# Patient Record
Sex: Male | Born: 1935 | Race: White | Hispanic: No | Smoking: Former smoker
Health system: Southern US, Community
[De-identification: ages and names within clinical notes are randomized; demographics above are authoritative.]

## PROBLEM LIST (undated history)

## (undated) DIAGNOSIS — I2510 Atherosclerotic heart disease of native coronary artery without angina pectoris: Secondary | ICD-10-CM

## (undated) DIAGNOSIS — E785 Hyperlipidemia, unspecified: Secondary | ICD-10-CM

## (undated) DIAGNOSIS — I499 Cardiac arrhythmia, unspecified: Secondary | ICD-10-CM

## (undated) DIAGNOSIS — Z950 Presence of cardiac pacemaker: Secondary | ICD-10-CM

## (undated) DIAGNOSIS — I10 Essential (primary) hypertension: Secondary | ICD-10-CM

## (undated) DIAGNOSIS — N400 Enlarged prostate without lower urinary tract symptoms: Secondary | ICD-10-CM

## (undated) DIAGNOSIS — D649 Anemia, unspecified: Secondary | ICD-10-CM

## (undated) DIAGNOSIS — M1990 Unspecified osteoarthritis, unspecified site: Secondary | ICD-10-CM

## (undated) HISTORY — PX: INSERT / REPLACE / REMOVE PACEMAKER: SUR710

## (undated) HISTORY — PX: CARDIAC CATHETERIZATION: SHX172

## (undated) HISTORY — PX: CORONARY ARTERY BYPASS GRAFT: SHX141

## (undated) HISTORY — PX: BACK SURGERY: SHX140

## (undated) HISTORY — PX: EYE SURGERY: SHX253

## (undated) HISTORY — PX: PENILE PROSTHESIS IMPLANT: SHX240

---

## 2013-10-11 DIAGNOSIS — I442 Atrioventricular block, complete: Secondary | ICD-10-CM | POA: Insufficient documentation

## 2013-11-18 ENCOUNTER — Other Ambulatory Visit (HOSPITAL_COMMUNITY): Payer: Self-pay | Admitting: Orthopaedic Surgery

## 2013-11-26 ENCOUNTER — Encounter (HOSPITAL_COMMUNITY)
Admission: RE | Admit: 2013-11-26 | Discharge: 2013-11-26 | Disposition: A | Discharge status: Home / Self Care | Payer: Medicare Other | Source: Ambulatory Visit | Attending: Orthopaedic Surgery | Admitting: Orthopaedic Surgery

## 2013-11-26 ENCOUNTER — Encounter (HOSPITAL_COMMUNITY): Payer: Self-pay

## 2013-11-26 ENCOUNTER — Other Ambulatory Visit: Payer: Self-pay

## 2013-11-26 HISTORY — DX: Benign prostatic hyperplasia without lower urinary tract symptoms: N40.0

## 2013-11-26 HISTORY — DX: Cardiac arrhythmia, unspecified: I49.9

## 2013-11-26 HISTORY — DX: Hyperlipidemia, unspecified: E78.5

## 2013-11-26 HISTORY — DX: Presence of cardiac pacemaker: Z95.0

## 2013-11-26 HISTORY — DX: Anemia, unspecified: D64.9

## 2013-11-26 HISTORY — DX: Essential (primary) hypertension: I10

## 2013-11-26 HISTORY — DX: Atherosclerotic heart disease of native coronary artery without angina pectoris: I25.10

## 2013-11-26 HISTORY — DX: Unspecified osteoarthritis, unspecified site: M19.90

## 2013-11-26 LAB — SURGICAL PCR SCREEN
MRSA, PCR: NEGATIVE
Staphylococcus aureus: POSITIVE — AB

## 2013-11-26 LAB — URINALYSIS, ROUTINE W REFLEX MICROSCOPIC
Bilirubin Urine: NEGATIVE
Glucose, UA: NEGATIVE mg/dL
Hgb urine dipstick: NEGATIVE
Ketones, ur: NEGATIVE mg/dL
Leukocytes, UA: NEGATIVE
NITRITE: NEGATIVE
Protein, ur: NEGATIVE mg/dL
SPECIFIC GRAVITY, URINE: 1.008 (ref 1.005–1.030)
UROBILINOGEN UA: 0.2 mg/dL (ref 0.0–1.0)
pH: 5.5 (ref 5.0–8.0)

## 2013-11-26 LAB — CBC
HCT: 41.1 % (ref 39.0–52.0)
Hemoglobin: 13.4 g/dL (ref 13.0–17.0)
MCH: 29.5 pg (ref 26.0–34.0)
MCHC: 32.6 g/dL (ref 30.0–36.0)
MCV: 90.5 fL (ref 78.0–100.0)
Platelets: 153 10*3/uL (ref 150–400)
RBC: 4.54 MIL/uL (ref 4.22–5.81)
RDW: 14.9 % (ref 11.5–15.5)
WBC: 5.5 10*3/uL (ref 4.0–10.5)

## 2013-11-26 LAB — COMPREHENSIVE METABOLIC PANEL
ALBUMIN: 4.3 g/dL (ref 3.5–5.2)
ALT: 10 U/L (ref 0–53)
ANION GAP: 19 — AB (ref 5–15)
AST: 24 U/L (ref 0–37)
Alkaline Phosphatase: 128 U/L — ABNORMAL HIGH (ref 39–117)
BILIRUBIN TOTAL: 0.6 mg/dL (ref 0.3–1.2)
BUN: 25 mg/dL — AB (ref 6–23)
CO2: 23 mEq/L (ref 19–32)
CREATININE: 1.58 mg/dL — AB (ref 0.50–1.35)
Calcium: 9.8 mg/dL (ref 8.4–10.5)
Chloride: 101 mEq/L (ref 96–112)
GFR calc Af Amer: 43 mL/min — ABNORMAL LOW (ref >90)
GFR calc non Af Amer: 37 mL/min — ABNORMAL LOW (ref >90)
Glucose, Bld: 87 mg/dL (ref 70–99)
Potassium: 4.4 mEq/L (ref 3.7–5.3)
Sodium: 143 mEq/L (ref 137–147)
Total Protein: 7.2 g/dL (ref 6.0–8.3)

## 2013-11-26 LAB — PROTIME-INR
INR: 0.98 (ref 0.00–1.49)
Prothrombin Time: 13 seconds (ref 11.6–15.2)

## 2013-11-26 NOTE — Progress Notes (Addendum)
Pacemaker is a St. Jude--Shane [name] is rep (# [id number]) Have left a message on his VM.  DA Information sent from [facility] on [address] In [place].  Office notes, "Evaluation for Periop Device management" in chart.  Have also sent to Dr. Lear NgLai Chow Kok, Md-cardiologist the "implanted cardiac device programming sheet"   DA Patient had tests done in [place], [place].  I faxed request to hospital and requested anything cardiac on this nice gentleman.  [phone]

## 2013-11-26 NOTE — Progress Notes (Deleted)
   11/26/13 1218  OBSTRUCTIVE SLEEP APNEA  Have you ever been diagnosed with sleep apnea through a sleep study? No  Do you snore loudly (loud enough to be heard through closed doors)?  0  Do you often feel tired, fatigued, or sleepy during the daytime? 0  Has anyone observed you stop breathing during your sleep? 0  Do you have, or are you being treated for high blood pressure? 1  BMI more than 35 kg/m2? 0  Age over 78 years old? 1  Neck circumference greater than 40 cm/16 inches? 1  Gender: 1  Obstructive Sleep Apnea Score 4  Score 4 or greater  Results sent to PCP

## 2013-11-26 NOTE — Progress Notes (Signed)
Occasionally sees MD @ [facility]  In [place], [place]

## 2013-11-26 NOTE — Pre-Procedure Instructions (Signed)
Shane [name]  11/26/2013   Your procedure is scheduled on:  Friday, Nov. 13th   Report [facility] at 5:30 AM.   Call this number if you have problems the morning of surgery: [phone]   Remember:   Do not eat food or drink liquids after midnight Thursday.   Take these medicines the morning of surgery with A SIP OF WATER: Nothing   Do not wear jewelry - no rings or watches.  Do not wear lotions or colognes.  You may NOT wear deodorant the morning of surgery.   Men may shave face and neck.  Do not bring valuables to the hospital.  [facility] is not responsible for any belongings or valuables.               Contacts, dentures or bridgework may not be worn into surgery.  Leave suitcase in the car. After surgery it may be brought to your room.  For patients admitted to the hospital, discharge time is determined by your treatment team.    Name and phone number of your driver:    Special Instructions: "Preparing for Surgery" instruction sheet.   Please read over the following fact sheets that you were given: Pain Booklet, Coughing and Deep Breathing, MRSA Information and Surgical Site Infection Prevention

## 2013-11-27 NOTE — Progress Notes (Signed)
Anesthesia Chart Review:  Patient is a 80 year old male scheduled for right THA on 11/29/13 by Dr. Ophelia CharterYates.  History includes former smoker, afib, St. Jude single chamber PPM (03/28/13; [facility] Dr. Marijean NiemannKeith Kyker), CAD s/p CABG 1981 with redo in 1992 (occluded LCX SVG, patient LIMA to LAD and SVG to RCA by 11/2012 cath), HLD, HTN, anemia, arthritis, BPH, back surgery X 4, penile prothesis implant. [place] recently moved to [place], [place].  His current cardiologist Dr. Lear NgLai Chow Kok with [facility] felt he was "low risk for periop cardiac complications." Permission to hold Eliquis 48 hours prior to surgery given.  Last PPM interrogation 11/14/13 with normal pacing function.  VP 66% of the time. No PCP is listed.   EKG on 11/26/13: afib at 70 bpm, LAD, right BBB. Tracing appears similar to prior tracing on 03/28/13 [facility] [place]  Cardiac [facility] Severe multi-vessel CAD with patent LIMA to LAD and SVG to RCA. Occluded SVG to LCX system. LM heavily calcified distal 80% eccentric stenosis at bifurcation. Ramus small with moderate diffuse disease. LAD proximal 100% occlusion. DIAG back fills from the LIMA. LCX proximal 60-70% lesion. OM1 ostail 50% and OM2 patent. RCA proximal and mid 100% occlusion. EDP 22. Medical therapy recommended. May benefit for PPM in view of significant bradycardia.  Holter to assess for pauses and a-flutter. If symptoms persistent then consider high risk PCI of the LM bifurcation.   Echo 10/25/07 [facility]): Technically difficult. Grossly normal LV size and EF > 65% with no definite segmental wall motion abnormalities. AV trileaflet with sclerosis. Dilated LA at 5.4 cm. Dilated RA. Mild AI, mild TR, with mildly elevated RVSP at 35 mm Hg, mild MR.  Preoperative labs noted.  BUN 25/Cr 1.58. Comparison [facility] indicate his Cr was 1.6 on 03/28/13 and on 12/04/12, so renal function appears stable.     Cardiology is aware of surgery  plans. If no acute changes then I could anticipate that he could proceed as planned.  Velna Ochsllison Sloan Takagi, PA-C [facility]/Anesthesiology Phone [phone] 11/27/2013 12:39 PM

## 2013-11-27 NOTE — H&P (Signed)
TOTAL HIP ADMISSION H&P  Patient is admitted for right total hip arthroplasty.  Subjective:  Chief Complaint: right hip pain  HPI: Shane [name], [age] y.o. male, has a history of pain and functional disability in the right hip(s) due to arthritis and patient has failed non-surgical conservative treatments for greater than 12 weeks to include NSAID's and/or analgesics, use of assistive devices and activity modification.  Onset of symptoms was gradual starting 2 years ago with gradually worsening course since that time.The patient noted no past surgery on the right hip(s).  Patient currently rates pain in the right hip at 8 out of 10 with activity. Patient has worsening of pain with activity and weight bearing, trendelenberg gait and pain that interfers with activities of daily living. Patient has evidence of subchondral sclerosis, joint subluxation and joint space narrowing by imaging studies. This condition presents safety issues increasing the risk of falls. This patient has had lumbar fusion..  There is no current active infection.  There are no active problems to display for this patient.  Past Medical History  Diagnosis Date  . Coronary artery disease   . Dysrhythmia     h/o  a flutter  . Hypertension   . Hyperlipidemia   . BPH (benign prostatic hyperplasia)   . Presence of permanent cardiac pacemaker     2015 for complete HB   St. Jude  pacer  . Arthritis   . Anemia     now taking iron pills    Past Surgical History  Procedure Laterality Date  . Coronary artery bypass graft      1981 with redo in 1992  . Cardiac catheterization      2014, EF is 55-60%  . Eye surgery      left eye cataract  . Insert / replace / remove pacemaker      St. Jude pacer  . Penile prosthesis implant    . Back surgery      has had 4 back surgeries in [place]    No prescriptions prior to admission   Not on File  History  Substance Use Topics  . Smoking status: Former Smoker -- 1.00  packs/day for 3.5 years    Quit date: 11/27/1987  . Smokeless tobacco: Not on file  . Alcohol Use: No     Comment: used to in his early yrs    No family history on file.   Review of Systems  Musculoskeletal: Positive for back pain and joint pain.  All other systems reviewed and are negative.   Objective:  Physical Exam  Constitutional: He is oriented to person, place, and time. He appears well-developed and well-nourished.  HENT:  Head: Normocephalic and atraumatic.  Eyes: EOM are normal. Pupils are equal, round, and reactive to light.  Neck: Neck supple.  Cardiovascular: Normal rate.   Respiratory: Effort normal.  GI: Soft.  Musculoskeletal:  5 degrees internal rotation, right hip, with extreme pain.  External rotation 30 degrees with reproduction of pain.  A 10-degree hip flexion contracture.  Opposite left hip shows 30-40 degrees internal/external rotation without pain.  No hip flexion contraction.  The knee incision is well healed.  No knee tenderness.  Flexion to 120 degrees.  Full extension.  Distal pulses are palpable.  Negative Homans.  Anterior tib/EHL is strong.  Has a pronounced Trendelenburg gait.    Neurological: He is alert and oriented to person, place, and time.  Skin: Skin is warm and dry.  Psychiatric: He has a normal  mood and affect.    Vital signs in last 24 hours:    Labs:   There is no height or weight on file to calculate BMI.   Imaging Review Plain radiographs demonstrate severe degenerative joint disease of the right hip(s). The bone quality appears to be adequate for age and reported activity level.  Assessment/Plan:  End stage arthritis, right hip(s)  The patient history, physical examination, clinical judgement of the provider and imaging studies are consistent with end stage degenerative joint disease of the right hip(s) and total hip arthroplasty is deemed medically necessary. The treatment options including medical management, injection  therapy, arthroscopy and arthroplasty were discussed at length. The risks and benefits of total hip arthroplasty were presented and reviewed. The risks due to aseptic loosening, infection, stiffness, dislocation/subluxation,  thromboembolic complications and other imponderables were discussed.  The patient acknowledged the explanation, agreed to proceed with the plan and consent was signed. Patient is being admitted for inpatient treatment for surgery, pain control, PT, OT, prophylactic antibiotics, VTE prophylaxis, progressive ambulation and ADL's and discharge planning.The patient is planning to be discharged home with home health services

## 2013-11-28 MED ORDER — CEFAZOLIN SODIUM-DEXTROSE 2-3 GM-% IV SOLR
2.0000 g | INTRAVENOUS | Status: AC
  Administered 2013-11-29: 2 g via INTRAVENOUS
  Filled 2013-11-28: qty 50

## 2013-11-29 ENCOUNTER — Inpatient Hospital Stay (HOSPITAL_COMMUNITY)
Admission: RE | Admit: 2013-11-29 | Discharge: 2013-12-03 | DRG: 470 | Disposition: A | Discharge status: Skilled Nursing Facility | Payer: Medicare Other | Source: Ambulatory Visit | Attending: Orthopaedic Surgery | Admitting: Orthopaedic Surgery

## 2013-11-29 ENCOUNTER — Encounter (HOSPITAL_COMMUNITY)
Admission: RE | Disposition: A | Discharge status: Skilled Nursing Facility | Payer: Self-pay | Source: Ambulatory Visit | Attending: Orthopaedic Surgery

## 2013-11-29 ENCOUNTER — Inpatient Hospital Stay (HOSPITAL_COMMUNITY): Payer: Medicare Other | Admitting: Anesthesiology

## 2013-11-29 ENCOUNTER — Inpatient Hospital Stay (HOSPITAL_COMMUNITY): Payer: Medicare Other

## 2013-11-29 ENCOUNTER — Inpatient Hospital Stay (HOSPITAL_COMMUNITY): Payer: Medicare Other | Admitting: Vascular Surgery

## 2013-11-29 DIAGNOSIS — Z419 Encounter for procedure for purposes other than remedying health state, unspecified: Secondary | ICD-10-CM

## 2013-11-29 DIAGNOSIS — Z950 Presence of cardiac pacemaker: Secondary | ICD-10-CM | POA: Diagnosis not present

## 2013-11-29 DIAGNOSIS — Z951 Presence of aortocoronary bypass graft: Secondary | ICD-10-CM

## 2013-11-29 DIAGNOSIS — E785 Hyperlipidemia, unspecified: Secondary | ICD-10-CM | POA: Diagnosis present

## 2013-11-29 DIAGNOSIS — Z87891 Personal history of nicotine dependence: Secondary | ICD-10-CM

## 2013-11-29 DIAGNOSIS — Z7982 Long term (current) use of aspirin: Secondary | ICD-10-CM

## 2013-11-29 DIAGNOSIS — M1611 Unilateral primary osteoarthritis, right hip: Secondary | ICD-10-CM | POA: Diagnosis present

## 2013-11-29 DIAGNOSIS — D62 Acute posthemorrhagic anemia: Secondary | ICD-10-CM | POA: Diagnosis not present

## 2013-11-29 DIAGNOSIS — I2510 Atherosclerotic heart disease of native coronary artery without angina pectoris: Secondary | ICD-10-CM | POA: Diagnosis present

## 2013-11-29 DIAGNOSIS — Z79899 Other long term (current) drug therapy: Secondary | ICD-10-CM | POA: Diagnosis not present

## 2013-11-29 DIAGNOSIS — M25551 Pain in right hip: Secondary | ICD-10-CM | POA: Diagnosis present

## 2013-11-29 DIAGNOSIS — I10 Essential (primary) hypertension: Secondary | ICD-10-CM | POA: Diagnosis present

## 2013-11-29 DIAGNOSIS — N400 Enlarged prostate without lower urinary tract symptoms: Secondary | ICD-10-CM | POA: Diagnosis present

## 2013-11-29 HISTORY — PX: TOTAL HIP ARTHROPLASTY: SHX124

## 2013-11-29 SURGERY — ARTHROPLASTY, HIP, TOTAL, ANTERIOR APPROACH
Anesthesia: General | Site: Hip | Laterality: Right

## 2013-11-29 MED ORDER — KETOROLAC TROMETHAMINE 30 MG/ML IJ SOLN
INTRAMUSCULAR | Status: AC
  Filled 2013-11-29: qty 1

## 2013-11-29 MED ORDER — PROPOFOL 10 MG/ML IV BOLUS
INTRAVENOUS | Status: AC
  Filled 2013-11-29: qty 20

## 2013-11-29 MED ORDER — ROCURONIUM BROMIDE 100 MG/10ML IV SOLN
INTRAVENOUS | Status: DC | PRN
  Administered 2013-11-29: 10 mg via INTRAVENOUS
  Administered 2013-11-29: 50 mg via INTRAVENOUS

## 2013-11-29 MED ORDER — GLYCOPYRROLATE 0.2 MG/ML IJ SOLN
INTRAMUSCULAR | Status: AC
  Filled 2013-11-29: qty 3

## 2013-11-29 MED ORDER — METHOCARBAMOL 500 MG PO TABS
ORAL_TABLET | ORAL | Status: AC
  Filled 2013-11-29: qty 1

## 2013-11-29 MED ORDER — DOCUSATE SODIUM 100 MG PO CAPS
100.0000 mg | ORAL_CAPSULE | Freq: Two times a day (BID) | ORAL | Status: DC
  Administered 2013-11-29 – 2013-12-03 (×8): 100 mg via ORAL
  Filled 2013-11-29 (×9): qty 1

## 2013-11-29 MED ORDER — FOLIC ACID 1 MG PO TABS
1.0000 mg | ORAL_TABLET | Freq: Every day | ORAL | Status: DC
  Administered 2013-11-29 – 2013-12-03 (×5): 1 mg via ORAL
  Filled 2013-11-29 (×5): qty 1

## 2013-11-29 MED ORDER — ZOLPIDEM TARTRATE 5 MG PO TABS: 5.0000 mg | ORAL_TABLET | Freq: Every evening | ORAL | Status: DC | PRN

## 2013-11-29 MED ORDER — METOCLOPRAMIDE HCL 5 MG/ML IJ SOLN: 5.0000 mg | Freq: Three times a day (TID) | INTRAMUSCULAR | Status: DC | PRN

## 2013-11-29 MED ORDER — OXYCODONE HCL 5 MG PO TABS
ORAL_TABLET | ORAL | Status: AC
  Filled 2013-11-29: qty 2

## 2013-11-29 MED ORDER — PHENOL 1.4 % MT LIQD: 1.0000 | OROMUCOSAL | Status: DC | PRN

## 2013-11-29 MED ORDER — 0.9 % SODIUM CHLORIDE (POUR BTL) OPTIME
TOPICAL | Status: DC | PRN
  Administered 2013-11-29: 1000 mL

## 2013-11-29 MED ORDER — FLEET ENEMA 7-19 GM/118ML RE ENEM: 1.0000 | ENEMA | Freq: Once | RECTAL | Status: AC | PRN

## 2013-11-29 MED ORDER — NEOSTIGMINE METHYLSULFATE 10 MG/10ML IV SOLN
INTRAVENOUS | Status: DC | PRN
  Administered 2013-11-29: 3 mg via INTRAVENOUS

## 2013-11-29 MED ORDER — IRBESARTAN 75 MG PO TABS
75.0000 mg | ORAL_TABLET | Freq: Every day | ORAL | Status: DC
  Administered 2013-11-29 – 2013-12-03 (×5): 75 mg via ORAL
  Filled 2013-11-29 (×5): qty 1

## 2013-11-29 MED ORDER — FENTANYL CITRATE 0.05 MG/ML IJ SOLN
INTRAMUSCULAR | Status: DC | PRN
  Administered 2013-11-29 (×3): 50 ug via INTRAVENOUS

## 2013-11-29 MED ORDER — ONDANSETRON HCL 4 MG PO TABS: 4.0000 mg | ORAL_TABLET | Freq: Four times a day (QID) | ORAL | Status: DC | PRN

## 2013-11-29 MED ORDER — ALUM & MAG HYDROXIDE-SIMETH 200-200-20 MG/5ML PO SUSP: 30.0000 mL | ORAL | Status: DC | PRN

## 2013-11-29 MED ORDER — ROCURONIUM BROMIDE 50 MG/5ML IV SOLN
INTRAVENOUS | Status: AC
  Filled 2013-11-29: qty 2

## 2013-11-29 MED ORDER — PHENYLEPHRINE HCL 10 MG/ML IJ SOLN
10.0000 mg | INTRAVENOUS | Status: DC | PRN
  Administered 2013-11-29: 20 ug/min via INTRAVENOUS

## 2013-11-29 MED ORDER — ARTIFICIAL TEARS OP OINT
TOPICAL_OINTMENT | OPHTHALMIC | Status: AC
  Filled 2013-11-29: qty 7

## 2013-11-29 MED ORDER — ACETAMINOPHEN 650 MG RE SUPP
650.0000 mg | Freq: Four times a day (QID) | RECTAL | Status: DC | PRN
Start: 2013-11-29 — End: 2013-12-03

## 2013-11-29 MED ORDER — GLYCOPYRROLATE 0.2 MG/ML IJ SOLN
INTRAMUSCULAR | Status: DC | PRN
  Administered 2013-11-29: .4 mg via INTRAVENOUS

## 2013-11-29 MED ORDER — FENTANYL CITRATE 0.05 MG/ML IJ SOLN
INTRAMUSCULAR | Status: AC
  Filled 2013-11-29: qty 2

## 2013-11-29 MED ORDER — ACETAMINOPHEN 325 MG PO TABS: 650.0000 mg | ORAL_TABLET | Freq: Four times a day (QID) | ORAL | Status: DC | PRN

## 2013-11-29 MED ORDER — OXYCODONE HCL 5 MG PO TABS
5.0000 mg | ORAL_TABLET | ORAL | Status: DC | PRN
  Administered 2013-11-29 – 2013-12-01 (×5): 10 mg via ORAL
  Administered 2013-12-01 – 2013-12-02 (×5): 5 mg via ORAL
  Administered 2013-12-02: 10 mg via ORAL
  Administered 2013-12-02 – 2013-12-03 (×3): 5 mg via ORAL
  Filled 2013-11-29 (×4): qty 2
  Filled 2013-11-29: qty 1
  Filled 2013-11-29: qty 2
  Filled 2013-11-29 (×4): qty 1
  Filled 2013-11-29 (×2): qty 2
  Filled 2013-11-29: qty 1

## 2013-11-29 MED ORDER — FENTANYL CITRATE 0.05 MG/ML IJ SOLN
25.0000 ug | INTRAMUSCULAR | Status: DC | PRN
  Administered 2013-11-29 (×3): 50 ug via INTRAVENOUS

## 2013-11-29 MED ORDER — SENNOSIDES-DOCUSATE SODIUM 8.6-50 MG PO TABS
1.0000 | ORAL_TABLET | Freq: Every evening | ORAL | Status: DC | PRN
  Administered 2013-12-01: 1 via ORAL
  Filled 2013-11-29: qty 1

## 2013-11-29 MED ORDER — KCL IN DEXTROSE-NACL 20-5-0.45 MEQ/L-%-% IV SOLN
INTRAVENOUS | Status: DC
  Administered 2013-11-30: 02:00:00 via INTRAVENOUS
  Filled 2013-11-29 (×9): qty 1000

## 2013-11-29 MED ORDER — OXYCODONE-ACETAMINOPHEN 5-325 MG PO TABS: 1.0000 | ORAL_TABLET | ORAL | Status: DC | PRN

## 2013-11-29 MED ORDER — VITAMIN B-12 1000 MCG PO TABS
1000.0000 ug | ORAL_TABLET | Freq: Every day | ORAL | Status: DC
  Administered 2013-11-29 – 2013-12-03 (×5): 1000 ug via ORAL
  Filled 2013-11-29 (×5): qty 1

## 2013-11-29 MED ORDER — LACTATED RINGERS IV SOLN
INTRAVENOUS | Status: DC | PRN
  Administered 2013-11-29 (×2): via INTRAVENOUS

## 2013-11-29 MED ORDER — MENTHOL 3 MG MT LOZG: 1.0000 | LOZENGE | OROMUCOSAL | Status: DC | PRN

## 2013-11-29 MED ORDER — METOCLOPRAMIDE HCL 10 MG PO TABS: 5.0000 mg | ORAL_TABLET | Freq: Three times a day (TID) | ORAL | Status: DC | PRN

## 2013-11-29 MED ORDER — MORPHINE SULFATE 2 MG/ML IJ SOLN
2.0000 mg | INTRAMUSCULAR | Status: DC | PRN
  Administered 2013-11-29: 2 mg via INTRAVENOUS
  Filled 2013-11-29: qty 1

## 2013-11-29 MED ORDER — NEOSTIGMINE METHYLSULFATE 10 MG/10ML IV SOLN
INTRAVENOUS | Status: AC
  Filled 2013-11-29: qty 3

## 2013-11-29 MED ORDER — LIDOCAINE HCL (CARDIAC) 20 MG/ML IV SOLN
INTRAVENOUS | Status: DC | PRN
  Administered 2013-11-29: 40 mg via INTRAVENOUS

## 2013-11-29 MED ORDER — MORPHINE SULFATE 2 MG/ML IJ SOLN
INTRAMUSCULAR | Status: AC
  Filled 2013-11-29: qty 1

## 2013-11-29 MED ORDER — ONDANSETRON HCL 4 MG/2ML IJ SOLN
INTRAMUSCULAR | Status: DC | PRN
  Administered 2013-11-29: 4 mg via INTRAVENOUS

## 2013-11-29 MED ORDER — FENTANYL CITRATE 0.05 MG/ML IJ SOLN
INTRAMUSCULAR | Status: AC
  Filled 2013-11-29: qty 5

## 2013-11-29 MED ORDER — KETOROLAC TROMETHAMINE 15 MG/ML IJ SOLN
7.5000 mg | Freq: Four times a day (QID) | INTRAMUSCULAR | Status: AC
  Administered 2013-11-29 – 2013-11-30 (×3): 7.5 mg via INTRAVENOUS
  Filled 2013-11-29: qty 1

## 2013-11-29 MED ORDER — ASPIRIN EC 325 MG PO TBEC
325.0000 mg | DELAYED_RELEASE_TABLET | Freq: Every day | ORAL | Status: DC
  Administered 2013-12-02: 325 mg via ORAL
  Filled 2013-11-29 (×5): qty 1

## 2013-11-29 MED ORDER — DEXTROSE 5 % IV SOLN
500.0000 mg | Freq: Four times a day (QID) | INTRAVENOUS | Status: DC | PRN
  Filled 2013-11-29: qty 5

## 2013-11-29 MED ORDER — FUROSEMIDE 20 MG PO TABS
20.0000 mg | ORAL_TABLET | Freq: Every day | ORAL | Status: DC
  Administered 2013-11-29 – 2013-12-03 (×5): 20 mg via ORAL
  Filled 2013-11-29 (×5): qty 1

## 2013-11-29 MED ORDER — ONDANSETRON HCL 4 MG/2ML IJ SOLN: 4.0000 mg | Freq: Four times a day (QID) | INTRAMUSCULAR | Status: DC | PRN

## 2013-11-29 MED ORDER — LIDOCAINE HCL (CARDIAC) 20 MG/ML IV SOLN
INTRAVENOUS | Status: AC
  Filled 2013-11-29: qty 10

## 2013-11-29 MED ORDER — BISACODYL 5 MG PO TBEC
5.0000 mg | DELAYED_RELEASE_TABLET | Freq: Every day | ORAL | Status: DC | PRN
  Administered 2013-12-01: 5 mg via ORAL
  Filled 2013-11-29: qty 1

## 2013-11-29 MED ORDER — CHLORHEXIDINE GLUCONATE 4 % EX LIQD
60.0000 mL | Freq: Once | CUTANEOUS | Status: DC
  Filled 2013-11-29: qty 60

## 2013-11-29 MED ORDER — PROPOFOL 10 MG/ML IV BOLUS
INTRAVENOUS | Status: DC | PRN
  Administered 2013-11-29: 100 mg via INTRAVENOUS

## 2013-11-29 MED ORDER — METHOCARBAMOL 500 MG PO TABS
500.0000 mg | ORAL_TABLET | Freq: Four times a day (QID) | ORAL | Status: DC | PRN
  Administered 2013-11-29 – 2013-11-30 (×3): 500 mg via ORAL
  Filled 2013-11-29 (×2): qty 1

## 2013-11-29 MED ORDER — GEMFIBROZIL 600 MG PO TABS
600.0000 mg | ORAL_TABLET | Freq: Two times a day (BID) | ORAL | Status: DC
  Administered 2013-11-30 – 2013-12-03 (×7): 600 mg via ORAL
  Filled 2013-11-29 (×9): qty 1

## 2013-11-29 SURGICAL SUPPLY — 48 items
BLADE SAW SGTL 18X1.27X75 (BLADE) ×2 IMPLANT
BLADE SAW SGTL 18X1.27X75MM (BLADE) ×1
BLADE SURG ROTATE 9660 (MISCELLANEOUS) IMPLANT
CAPT HIP PF MOP ×3 IMPLANT
CELLS DAT CNTRL 66122 CELL SVR (MISCELLANEOUS) ×1 IMPLANT
COVER SURGICAL LIGHT HANDLE (MISCELLANEOUS) ×3 IMPLANT
DERMABOND ADVANCED (GAUZE/BANDAGES/DRESSINGS) ×2
DERMABOND ADVANCED .7 DNX12 (GAUZE/BANDAGES/DRESSINGS) ×1 IMPLANT
DRAPE C-ARM 42X72 X-RAY (DRAPES) ×3 IMPLANT
DRAPE IMP U-DRAPE 54X76 (DRAPES) ×3 IMPLANT
DRAPE STERI IOBAN 125X83 (DRAPES) ×3 IMPLANT
DRAPE U-SHAPE 47X51 STRL (DRAPES) ×9 IMPLANT
DRSG MEPILEX BORDER 4X8 (GAUZE/BANDAGES/DRESSINGS) ×3 IMPLANT
DURAPREP 26ML APPLICATOR (WOUND CARE) ×3 IMPLANT
ELECT BLADE 4.0 EZ CLEAN MEGAD (MISCELLANEOUS) ×3
ELECT CAUTERY BLADE 6.4 (BLADE) IMPLANT
ELECT REM PT RETURN 9FT ADLT (ELECTROSURGICAL) ×3
ELECTRODE BLDE 4.0 EZ CLN MEGD (MISCELLANEOUS) ×1 IMPLANT
ELECTRODE REM PT RTRN 9FT ADLT (ELECTROSURGICAL) ×1 IMPLANT
FACESHIELD WRAPAROUND (MASK) ×3 IMPLANT
GLOVE BIOGEL PI IND STRL 7.5 (GLOVE) ×1 IMPLANT
GLOVE BIOGEL PI IND STRL 8 (GLOVE) ×1 IMPLANT
GLOVE BIOGEL PI INDICATOR 7.5 (GLOVE) ×2
GLOVE BIOGEL PI INDICATOR 8 (GLOVE) ×2
GLOVE ECLIPSE 7.0 STRL STRAW (GLOVE) ×3 IMPLANT
GLOVE ORTHO TXT STRL SZ7.5 (GLOVE) ×6 IMPLANT
GOWN STRL REUS W/ TWL LRG LVL3 (GOWN DISPOSABLE) ×3 IMPLANT
GOWN STRL REUS W/ TWL XL LVL3 (GOWN DISPOSABLE) ×1 IMPLANT
GOWN STRL REUS W/TWL LRG LVL3 (GOWN DISPOSABLE) ×6
GOWN STRL REUS W/TWL XL LVL3 (GOWN DISPOSABLE) ×2
KIT BASIN OR (CUSTOM PROCEDURE TRAY) ×3 IMPLANT
KIT ROOM TURNOVER OR (KITS) ×3 IMPLANT
MANIFOLD NEPTUNE II (INSTRUMENTS) ×3 IMPLANT
NS IRRIG 1000ML POUR BTL (IV SOLUTION) ×3 IMPLANT
PACK TOTAL JOINT (CUSTOM PROCEDURE TRAY) ×3 IMPLANT
PACK UNIVERSAL I (CUSTOM PROCEDURE TRAY) ×3 IMPLANT
PAD ARMBOARD 7.5X6 YLW CONV (MISCELLANEOUS) ×12 IMPLANT
RTRCTR WOUND ALEXIS 18CM MED (MISCELLANEOUS) ×3
SPONGE LAP 4X18 X RAY DECT (DISPOSABLE) ×3 IMPLANT
SUT ETHIBOND NAB CT1 #1 30IN (SUTURE) IMPLANT
SUT VIC AB 0 CT1 27 (SUTURE) ×2
SUT VIC AB 0 CT1 27XBRD ANBCTR (SUTURE) ×1 IMPLANT
SUT VIC AB 2-0 CT1 27 (SUTURE) ×2
SUT VIC AB 2-0 CT1 TAPERPNT 27 (SUTURE) ×1 IMPLANT
SUT VICRYL 4-0 PS2 18IN ABS (SUTURE) ×3 IMPLANT
SUT VLOC 180 0 24IN GS25 (SUTURE) ×3 IMPLANT
TOWEL OR 17X24 6PK STRL BLUE (TOWEL DISPOSABLE) ×3 IMPLANT
TOWEL OR 17X26 10 PK STRL BLUE (TOWEL DISPOSABLE) ×3 IMPLANT

## 2013-11-29 NOTE — Anesthesia Preprocedure Evaluation (Addendum)
Anesthesia Evaluation  Patient identified by MRN, date of birth, ID band Patient awake    Reviewed: Allergy & Precautions, H&P , NPO status , Patient's Chart, lab work & pertinent test results  Airway Mallampati: II  TM Distance: >3 FB Neck ROM: Full    Dental no notable dental hx. (+) Teeth Intact, Dental Advisory Given   Pulmonary neg pulmonary ROS, former smoker,  breath sounds clear to auscultation  Pulmonary exam normal       Cardiovascular hypertension, Pt. on medications + CAD and + CABG + dysrhythmias Atrial Fibrillation + pacemaker Rhythm:Regular Rate:Normal     Neuro/Psych negative neurological ROS  negative psych ROS   GI/Hepatic negative GI ROS, Neg liver ROS,   Endo/Other  negative endocrine ROS  Renal/GU negative Renal ROS  negative genitourinary   Musculoskeletal  (+) Arthritis -, Osteoarthritis,    Abdominal   Peds  Hematology negative hematology ROS (+)   Anesthesia Other Findings   Reproductive/Obstetrics negative OB ROS                            Anesthesia Physical Anesthesia Plan  ASA: III  Anesthesia Plan: General   Post-op Pain Management:    Induction: Intravenous  Airway Management Planned: Oral ETT  Additional Equipment:   Intra-op Plan:   Post-operative Plan: Extubation in OR  Informed Consent: I have reviewed the patients History and Physical, chart, labs and discussed the procedure including the risks, benefits and alternatives for the proposed anesthesia with the patient or authorized representative who has indicated his/her understanding and acceptance.   Dental advisory given  Plan Discussed with: CRNA  Anesthesia Plan Comments:         Anesthesia Quick Evaluation

## 2013-11-29 NOTE — Transfer of Care (Signed)
Immediate Anesthesia Transfer of Care Note  Patient: Shane [name]  Procedure(s) Performed: Procedure(s): TOTAL HIP ARTHROPLASTY ANTERIOR APPROACH (Right)  Patient Location: PACU  Anesthesia Type:General  Level of Consciousness: awake, alert  and oriented  Airway & Oxygen Therapy: Patient Spontanous Breathing and Patient connected to nasal cannula oxygen  Post-op Assessment: Report given to PACU RN and Post -op Vital signs reviewed and stable  Post vital signs: Reviewed and stable  Complications: No apparent anesthesia complications

## 2013-11-29 NOTE — Brief Op Note (Cosign Needed)
11/29/2013  10:20 AM  PATIENT:  Gagandeep Eastridge  80 y.o. male  PRE-OPERATIVE DIAGNOSIS:  Right Hip Osteoarthritis  POST-OPERATIVE DIAGNOSIS:  Right Hip Osteoarthritis  PROCEDURE:  Procedure(s): TOTAL HIP ARTHROPLASTY ANTERIOR APPROACH (Right)  SURGEON:  Surgeon(s) and Role:    * Eldred MangesMark C Yates, MD - Primary  PHYSICIAN ASSISTANT: Maud DeedSheila Dequavious Harshberger PAC  ASSISTANTS: none   ANESTHESIA:   general  EBL:  Total I/O In: 1700 [I.V.:1700] Out: 700 [Blood:700]  BLOOD ADMINISTERED:none  DRAINS: none   LOCAL MEDICATIONS USED:  NONE  SPECIMEN:  No Specimen  DISPOSITION OF SPECIMEN:  N/A  COUNTS:  YES  TOURNIQUET:  * No tourniquets in log *  DICTATION: .Note written in EPIC  PLAN OF CARE: Admit to inpatient   PATIENT DISPOSITION:  PACU - hemodynamically stable.   Delay start of Pharmacological VTE agent (>24hrs) due to surgical blood loss or risk of bleeding: no

## 2013-11-29 NOTE — Op Note (Signed)
1234 test 1234 test 12  Preop diagnosis: right hip osteoarthritis, primary, monoarticular.  Postop diagnosis: Same  Procedure: Right total hip arthroplasty direct anterior approach  Surgeon: Annell GreeningMark Keziyah Kneale M.D.  Assistant: Maud DeedSheila Vernon PA-C medically necessary and present for the entire procedure  EBL: 700 mL  Drains none  Anesthesia: Gen.  Implants: Depuy Corail #12 CLM stem press-fit+1.5 ball 52 mm Pinnacle press-fit cup Poly no lip liner  Procedure: After induction general anesthesia Ancef prophylaxis patient had a positive MSSA culture. Ancef was given is placed on a table. Positioning boots were applied before he was transitioned amount of table to make sure his heels down. Large boots were used Center prepping draping FCR was brought in confirmation of the ability to see both hips check leg length. DuraPrep split sheets drapes large shower curtain Betadine Steri-Drape application. Timeout procedure completed. Direct anterior approach was made. Fascia was split skin retractor with the skin protector was applied. Transverse vessels were coagulated capsule was opened suffocating retractor set cobras were placed inside the capsule under arrest C-arm visualization neck was cut. Slightly long was cut down another 2 mm after broaching the acetabulum and trials. Acetabulum was prepared first sequential reaming up to 51 labrum was resected some medialization since he had a slight lateral encourage of his native hip. It was reamed on the base the fovea reamed to 5152 cup was inserted no holes secured impacted in final seating done under fluoroscopy to make sure cup flexion inversion were in good position. All centralizer was placed  a polyethylene liner without a lip was inserted impacted in place and checked with inferior was secure.  Focus placed tip was taken down and under. Hydraulic a couple the femur up Mueller retractor medially. Center preparation of the canal was performed with the trial  insert of a #11 the neck was a little bit long's was cut another 2 mm some further release of capsule. Due to the arthritis in his hip with exposed subchondral bone on the head he will was a little bit short and one measurements he was still low 2 mm long. High offset hip was selected +1.5 ball after shortening and neck slightly 12 stem was inserted +1.5 but ball popped and there is excellent stability of the 45 of extension external rotation past 90 hip was still stable. Trace shock totally stable at 90 external rotation in neutral position. After initial saline solution final spot pictures were taken for documentation. AP and lateral the femur were checked. Good fit and fill of the stem. Good stability. Leg lengths were equal irrigation the lock suture in the fascia. 2-0 Vicryl subtendinous tissue subarticular skin closure postop dressing and transferred recovery room. Signed Annell GreeningMark Khrystal Jeanmarie M.D.

## 2013-11-29 NOTE — Anesthesia Postprocedure Evaluation (Signed)
  Anesthesia Post-op Note  Patient: Shane [name]  Procedure(s) Performed: Procedure(s): TOTAL HIP ARTHROPLASTY ANTERIOR APPROACH (Right)  Patient Location: PACU  Anesthesia Type:General  Level of Consciousness: awake and alert   Airway and Oxygen Therapy: Patient Spontanous Breathing  Post-op Pain: mild  Post-op Assessment: Post-op Vital signs reviewed, Patient's Cardiovascular Status Stable and Respiratory Function Stable  Post-op Vital Signs: Reviewed  Filed Vitals:   11/29/13 1047  BP:   Pulse: 60  Temp:   Resp: 7    Complications: No apparent anesthesia complications

## 2013-11-29 NOTE — Interval H&P Note (Signed)
History and Physical Interval Note:  11/29/2013 7:14 AM  Shane [name]  has presented today for surgery, with the diagnosis of Right Hip Osteoarthritis  The various methods of treatment have been discussed with the patient and family. After consideration of risks, benefits and other options for treatment, the patient has consented to  Procedure(s): TOTAL HIP ARTHROPLASTY ANTERIOR APPROACH (Right) as a surgical intervention .  The patient's history has been reviewed, patient examined, no change in status, stable for surgery.  I have reviewed the patient's chart and labs.  Questions were answered to the patient's satisfaction.     [name] C

## 2013-11-29 NOTE — Progress Notes (Signed)
Utilization review completed.  

## 2013-11-30 DIAGNOSIS — D62 Acute posthemorrhagic anemia: Secondary | ICD-10-CM | POA: Diagnosis not present

## 2013-11-30 LAB — BASIC METABOLIC PANEL
Anion gap: 12 (ref 5–15)
BUN: 32 mg/dL — AB (ref 6–23)
CO2: 26 mEq/L (ref 19–32)
Calcium: 8.6 mg/dL (ref 8.4–10.5)
Chloride: 101 mEq/L (ref 96–112)
Creatinine, Ser: 1.84 mg/dL — ABNORMAL HIGH (ref 0.50–1.35)
GFR calc non Af Amer: 31 mL/min — ABNORMAL LOW (ref >90)
GFR, EST AFRICAN AMERICAN: 36 mL/min — AB (ref >90)
Glucose, Bld: 123 mg/dL — ABNORMAL HIGH (ref 70–99)
POTASSIUM: 4.6 meq/L (ref 3.7–5.3)
SODIUM: 139 meq/L (ref 137–147)

## 2013-11-30 LAB — CBC
HCT: 28.8 % — ABNORMAL LOW (ref 39.0–52.0)
Hemoglobin: 9.4 g/dL — ABNORMAL LOW (ref 13.0–17.0)
MCH: 30.2 pg (ref 26.0–34.0)
MCHC: 32.6 g/dL (ref 30.0–36.0)
MCV: 92.6 fL (ref 78.0–100.0)
Platelets: 134 10*3/uL — ABNORMAL LOW (ref 150–400)
RBC: 3.11 MIL/uL — ABNORMAL LOW (ref 4.22–5.81)
RDW: 15.2 % (ref 11.5–15.5)
WBC: 4.7 10*3/uL (ref 4.0–10.5)

## 2013-11-30 NOTE — Plan of Care (Signed)
Problem: Phase I Progression Outcomes Goal: CMS/Neurovascular status WDL Outcome: Completed/Met Date Met:  11/30/13 Goal: Pain controlled with appropriate interventions Outcome: Completed/Met Date Met:  11/30/13 Goal: Dangle or out of bed evening of surgery Outcome: Completed/Met Date Met:  11/30/13

## 2013-11-30 NOTE — Evaluation (Signed)
Physical Therapy Evaluation Patient Details Name: Shane [name] MRN: [id number] DOB: 06/16/1922 Today's Date: 11/30/2013   History of Present Illness  80 yo male right THA direct anterior  Clinical Impression  Pt was seen for evaluation of his initial mobility after elective RTHA with direct anterior approach. Pt is having a good day for pain management and his PLOF is quite good.  He will plan to go home and have HHPT follow up but will need to demonstrate ability to climb stairs to meet this goal.      Follow Up Recommendations Home health PT;Supervision/Assistance - 24 hour    Equipment Recommendations  None recommended by PT    Recommendations for Other Services       Precautions / Restrictions Precautions Precautions: Fall Restrictions Weight Bearing Restrictions: Yes RLE Weight Bearing: Weight bearing as tolerated Other Position/Activity Restrictions:        Mobility  Bed Mobility Overal bed mobility: Needs Assistance Bed Mobility: Supine to Sit     Supine to sit: Min assist (assisted )     General bed mobility comments: Assisted RLE off bed and assist under trunk with pt using HOB elevation  Transfers Overall transfer level: Needs assistance Equipment used: Rolling walker (2 wheeled);1 person hand held assist Transfers: Sit to/from [employer]Stand;Stand Pivot Transfers Sit to Stand: Min assist Stand pivot transfers: Min assist       General transfer comment: prompting hand placement and  body mechanics  Ambulation/Gait Ambulation/Gait assistance: Min guard;Min assist Ambulation Distance (Feet): 120 Feet Assistive device: Rolling walker (2 wheeled);1 person hand held assist;None (one to guard with chair as pt was lightheaded) Gait Pattern/deviations: Decreased weight shift to right;Decreased dorsiflexion - right;Decreased dorsiflexion - left;Step-through pattern;Decreased stride length;Trunk flexed;Wide base of support;Antalgic Gait velocity: reduced Gait  velocity interpretation: Below normal speed for age/gender    Stairs            Wheelchair Mobility    Modified Rankin (Stroke Patients Only)       Balance Overall balance assessment: Needs assistance Sitting-balance support: Feet supported Sitting balance-Leahy Scale: Fair   Postural control: Other (comment) (leaning forward due to spine') Standing balance support: Bilateral upper extremity supported Standing balance-Leahy Scale: Fair Standing balance comment: dynamic balance is poor                             Pertinent Vitals/Pain Pain Assessment: 0-10 Pain Score: 2  Pain Location: R hip Pain Descriptors / Indicators: Aching Pain Intervention(s): Limited activity within patient's tolerance;Premedicated before session    Home Living Family/patient expects to be discharged to:: Private residence Living Arrangements: Spouse/significant other Available Help at Discharge: Family Type of Home: House Home Access: Stairs to enter Entrance Stairs-Rails: Right Entrance Stairs-Number of Steps: 1 Home Layout: Two level;Able to live on main level with bedroom/bathroom Home Equipment: Gilmer MorCane - single point;Walker - 2 wheels      Prior Function Level of Independence: Independent with assistive device(s)               Hand Dominance   Dominant Hand: Right    Extremity/Trunk Assessment   Upper Extremity Assessment: Overall WFL for tasks assessed           Lower Extremity Assessment: Generalized weakness      Cervical / Trunk Assessment: Normal;Kyphotic;Other exceptions (Spinal hardware)  Communication   Communication: No difficulties  Cognition Arousal/Alertness: Awake/alert Behavior During Therapy: WFL for tasks assessed/performed Overall Cognitive Status: Within  Functional Limits for tasks assessed                      General Comments General comments (skin integrity, edema, etc.): Pt is younger in appearance than stated age,  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    Exercises        Assessment/Plan    PT Assessment Patient needs continued PT services  PT Diagnosis Difficulty walking   PT Problem List Decreased strength;Decreased range of motion;Decreased activity tolerance;Decreased balance;Decreased mobility;Decreased coordination;Pain;Decreased skin integrity  PT Treatment Interventions DME instruction;Gait training;Stair training;Functional mobility training;Therapeutic activities;Therapeutic exercise;Balance training;Neuromuscular re-education;Patient/family education   PT Goals (Current goals can be found in the Care Plan section) Acute Rehab PT Goals Patient Stated Goal: To get home and get up/down stairs PT Goal Formulation: With patient Time For Goal Achievement: 12/07/13 Potential to Achieve Goals: Good    Frequency 7X/week   Barriers to discharge Inaccessible home environment;Decreased caregiver support Wife is unable to assist him due to her health issues, no family living there otherwise    Co-evaluation PT/OT/SLP Co-Evaluation/Treatment: Yes Reason for Co-Treatment: For patient/therapist safety PT goals addressed during session: Mobility/safety with mobility;Balance;Proper use of DME OT goals addressed during session: ADL's and self-care;Strengthening/ROM       End of Session Equipment Utilized During Treatment: Gait belt;Other (comment) (FWW) Activity Tolerance: Patient tolerated treatment well;Patient limited by pain Patient left: in chair;with call bell/phone within reach Nurse Communication: Mobility status         Time: [id number] PT Time Calculation (min) (ACUTE ONLY): 33 min   Charges:   PT Evaluation $Initial PT Evaluation Tier I: 1 Procedure PT Treatments $Gait Training: 8-22 mins   PT G CodesIvar Drape:          Gwenna Fuston E 11/30/2013, 12:28 PM   Samul Dadauth Wakeelah Solan, PT MS Acute Rehab Dept. Number:  [phone]

## 2013-11-30 NOTE — Care Management Note (Addendum)
CARE MANAGEMENT NOTE 11/30/2013  Patient:  Shane [name],Shane [name]   Account Number:  [id number]  Date Initiated:  11/30/2013  Documentation initiated by:  Oliveras-Aizpurua,Elishah Ashmore  Subjective/Objective Assessment:   80 yo male admitted for a R THA.     Action/Plan:   Met with pt. He plans to return home with the support of his wife and step daughter. He has a walker. He needs a bed side commode. He doesn't have a preference for a [place] provider for HHPT or DME.   Anticipated DC Date:  12/02/2013   Anticipated DC Plan:  [place]  CM consult      [facility] Choice  NA   Choice offered to / List presented to:  C-1 Patient   DME arranged  [place]      DME agency  [place] arranged  [facility]   Status of service:  In process, will continue to follow Medicare Important Message given?   (If response is "NO", the following Medicare IM given date fields will be blank) Date Medicare IM given:   Medicare IM given by:   Date Additional Medicare IM given:   Additional Medicare IM given by:    Discharge Disposition:  [place]  Per UR Regulation:    If discussed at Long Length of Stay Meetings, dates discussed:    Comments:  HHPT was prearranged with [facility]. Will fax information to [facility]. Will contact [facility] for DME. Member agrees with [facility] for HHPT and [facility] for DME.  Norina Buzzard, RN, BSN, CM

## 2013-11-30 NOTE — Progress Notes (Signed)
Subjective: 1 Day Post-Op Procedure(s) (LRB): TOTAL HIP ARTHROPLASTY ANTERIOR APPROACH (Right) Patient reports pain as mild.    Objective: Vital signs in last 24 hours: Temp:  [97.6 F (36.4 C)-98.5 F (36.9 C)] 98.4 F (36.9 C) (11/14 0606) Pulse Rate:  [57-72] 61 (11/14 0606) Resp:  [7-22] 16 (11/14 0800) BP: (87-121)/(47-59) 87/47 mmHg (11/14 0606) SpO2:  [95 %-100 %] 95 % (11/14 0606)  Intake/Output from previous day: 11/13 0701 - 11/14 0700 In: 2525 [I.V.:2525] Out: 900 [Urine:200; Blood:700] Intake/Output this shift: Total I/O In: -  Out: 100 [Urine:100]   Recent Labs  11/30/13 0615  HGB 9.4*    Recent Labs  11/30/13 0615  WBC 4.7  RBC 3.11*  HCT 28.8*  PLT 134*    Recent Labs  11/30/13 0615  NA 139  K 4.6  CL 101  CO2 26  BUN 32*  CREATININE 1.84*  GLUCOSE 123*  CALCIUM 8.6   No results for input(s): LABPT, INR in the last 72 hours.  Neurologically intact  Dressing dry  Assessment/Plan: 1 Day Post-Op Procedure(s) (LRB): TOTAL HIP ARTHROPLASTY ANTERIOR APPROACH (Right) Up with therapy he wants to go home and not SNF. Wife at home with poor health. Acute blood loss anemia .  Likely home possibly Tuesday ( extra day to make sure safely ambulating). Likely HHPT so we can add bath aide to help with home activities.   Kareem Cathey C 11/30/2013, 9:55 AM

## 2013-11-30 NOTE — Evaluation (Signed)
Occupational Therapy Evaluation Patient Details Name: Shane DuffelHershel Sparano MRN: [id number] DOB: July 21, 1922 Today's Date: 11/30/2013    History of Present Illness 80 yo male right THA direct anterior   Clinical Impression   This 80 yo male admitted with above presents to acute OT with decreased mobility, decreased balance, and pain all affecting his ability to care for himself, he will benefit from acute OT without need for follow up.    Follow Up Recommendations  No OT follow up    Equipment Recommendations   (Pt reports he feels he will do fine without a 3n1)       Precautions / Restrictions Precautions Precautions: Fall Restrictions Weight Bearing Restrictions: Yes RLE Weight Bearing: Weight bearing as tolerated Other Position/Activity Restrictions:        Mobility Bed Mobility Overal bed mobility: Needs Assistance Bed Mobility: Supine to Sit     Supine to sit: Min assist (assisted )     General bed mobility comments: Assisted RLE off bed and assist under trunk with pt using HOB elevation  Transfers Overall transfer level: Needs assistance Equipment used: Rolling walker (2 wheeled);1 person hand held assist Transfers: Sit to/from UGI CorporationStand;Stand Pivot Transfers Sit to Stand: Min assist Stand pivot transfers: Min assist       General transfer comment: prompting hand placement and  body mechanics    Balance Overall balance assessment: Needs assistance Sitting-balance support: Feet supported Sitting balance-Leahy Scale: Fair   Postural control: Other (comment) (leaning forward due to spine') Standing balance support: Bilateral upper extremity supported Standing balance-Leahy Scale: Fair Standing balance comment: dynamic balance is poor                            ADL Overall ADL's : Needs assistance/impaired Eating/Feeding: Independent;Sitting   Grooming: Wash/dry hands;Min guard;Standing   Upper Body Bathing: Set up;Sitting   Lower Body Bathing:  Maximal assistance (with min guard A sit<>stand)   Upper Body Dressing : Set up;Sitting   Lower Body Dressing: Maximal assistance (with minguard A sit<stand)   Toilet Transfer: Min guard;Ambulation;RW;BSC (over toilet)   Toileting- Clothing Manipulation and Hygiene: Min guard;Sit to/from stand                         Pertinent Vitals/Pain Pain Assessment: 0-10 Pain Score: 2  Pain Location: R hip Pain Descriptors / Indicators: Aching Pain Intervention(s): Limited activity within patient's tolerance;Premedicated before session     Hand Dominance Right   Extremity/Trunk Assessment Upper Extremity Assessment Upper Extremity Assessment: Overall WFL for tasks assessed   Lower Extremity Assessment Lower Extremity Assessment: Generalized weakness   Cervical / Trunk Assessment Cervical / Trunk Assessment: Normal;Kyphotic;Other exceptions (Spinal hardware)   Communication Communication Communication: No difficulties   Cognition Arousal/Alertness: Awake/alert Behavior During Therapy: WFL for tasks assessed/performed Overall Cognitive Status: Within Functional Limits for tasks assessed                                Home Living Family/patient expects to be discharged to:: Private residence Living Arrangements: Spouse/significant other Available Help at Discharge: Family Type of Home: House Home Access: Stairs to enter Secretary/administratorntrance Stairs-Number of Steps: 1 Entrance Stairs-Rails: Right Home Layout: Two level;Able to live on main level with bedroom/bathroom Alternate Level Stairs-Number of Steps: flight--wants to be able to go to his office in the basement Alternate Level Stairs-Rails: Right;Left;Can reach both  Bathroom Shower/Tub: Tub/shower unit;Curtain Shower/tub characteristics: Engineer, building servicesCurtain Bathroom Toilet: Standard     Home Equipment: Cane - single point;Walker - 2 wheels          Prior Functioning/Environment Level of Independence: Independent with  assistive device(s)             OT Diagnosis: Generalized weakness;Acute pain   OT Problem List: Decreased strength;Decreased range of motion;Impaired balance (sitting and/or standing);Pain;Decreased knowledge of use of DME or AE   OT Treatment/Interventions: Self-care/ADL training;Patient/family education;DME and/or AE instruction    OT Goals(Current goals can be found in the care plan section) Acute Rehab OT Goals Patient Stated Goal: To get home and get up/down stairs OT Goal Formulation: With patient Time For Goal Achievement: 12/01/13 Potential to Achieve Goals: Good  OT Frequency: Min 2X/week           Co-evaluation PT/OT/SLP Co-Evaluation/Treatment: Yes Reason for Co-Treatment: For patient/therapist safety PT goals addressed during session: Mobility/safety with mobility;Balance;Proper use of DME OT goals addressed during session: ADL's and self-care;Strengthening/ROM      End of Session Equipment Utilized During Treatment: Gait belt;Rolling walker  Activity Tolerance: Patient tolerated treatment well Patient left: in chair;with call bell/phone within reach   Time: 1142-1212 OT Time Calculation (min): 30 min Charges:  OT General Charges $OT Visit: 1 Procedure OT Evaluation $Initial OT Evaluation Tier I: 1 Procedure OT Treatments $Self Care/Home Management : 8-22 mins     Evette GeorgesLeonard, Doneisha Ivey Eva [phone] 11/30/2013, 12:44 PM

## 2013-12-01 LAB — CBC
HCT: 26.2 % — ABNORMAL LOW (ref 39.0–52.0)
Hemoglobin: 8.8 g/dL — ABNORMAL LOW (ref 13.0–17.0)
MCH: 30.7 pg (ref 26.0–34.0)
MCHC: 33.6 g/dL (ref 30.0–36.0)
MCV: 91.3 fL (ref 78.0–100.0)
PLATELETS: 113 10*3/uL — AB (ref 150–400)
RBC: 2.87 MIL/uL — AB (ref 4.22–5.81)
RDW: 15 % (ref 11.5–15.5)
WBC: 5.1 10*3/uL (ref 4.0–10.5)

## 2013-12-01 NOTE — Progress Notes (Signed)
Occupational Therapy Treatment Patient Details Name: Shane DuffelHershel [name] MRN: [id number] DOB: [birth year] Today's Date: 12/01/2013    History of present illness 80 yo male right THA direct anterior   OT comments  Pt seen today for ADLs and functional mobility. Pt was able to don underpants with min (A) and perform toileting and grooming in bathroom. Pt fatigues quickly and is limited by decreased ROM of RLE and educated on energy conservation and safety with ADLs at home. Pt hopes to d/c today and feel that he is safe for d/c from OT standpoint, with 24/7 supervision.    Follow Up Recommendations  No OT follow up    Equipment Recommendations  3 in 1 bedside comode    Recommendations for Other Services      Precautions / Restrictions Precautions Precautions: Fall Restrictions Weight Bearing Restrictions: Yes RLE Weight Bearing: Weight bearing as tolerated       Mobility Bed Mobility Overal bed mobility: Needs Assistance Bed Mobility: Supine to Sit     Supine to sit: Supervision;HOB elevated     General bed mobility comments: Supervision for safety. Pt able to manage Bil LEs off bed. No use of bed rail.   Transfers Overall transfer level: Needs assistance Equipment used: Rolling walker (2 wheeled);1 person hand held assist Transfers: Sit to/from Stand Sit to Stand: Min guard         General transfer comment: Min guard for safety when powering up. VC's for hand placement.         ADL Overall ADL's : Needs assistance/impaired     Grooming: Wash/dry face;Oral care;Min guard;Standing               Lower Body Dressing: Sit to/from stand;Moderate assistance Lower Body Dressing Details (indicate cue type and reason): (A) to thread RLE through underwear. Pt will need assist for socks and shoes as well on RLE.  Toilet Transfer: Min guard;RW;Comfort height toilet   Toileting- ArchitectClothing Manipulation and Hygiene: Min guard;Sit to/from stand       Functional  mobility during ADLs: Min guard;Rolling walker General ADL Comments: Pt fatigued quickly following toileting and grooming tasks and discussed importance of rest breaks and energy conservation by sitting for ADLs. Pt agrees and plans to sponge bathe until improved mobility.                 Cognition  Arousal/Alertness: Awake/Alert Behavior During Therapy: WFL for tasks assessed/performed Overall Cognitive Status: Within Functional Limits for tasks assessed                                    Pertinent Vitals/ Pain       Pain Assessment: No/denies pain         Frequency Min 2X/week     Progress Toward Goals  OT Goals(current goals can now be found in the care plan section)  Progress towards OT goals: Progressing toward goals  Acute Rehab OT Goals Patient Stated Goal: Home today  Plan Discharge plan remains appropriate       End of Session Equipment Utilized During Treatment: Gait belt;Rolling walker   Activity Tolerance Patient tolerated treatment well   Patient Left in chair;with call bell/phone within reach   Nurse Communication          Time: [id number] OT Time Calculation (min): 50 min (minus ~15 minutes for toileting as pt attempted to have bowel movement)  Charges: OT General Charges $  OT Visit: 1 Procedure OT Treatments $Self Care/Home Management : 23-37 mins  Rae LipsMiller, Jaylia Pettus M 12/01/2013, 9:34 AM  Carney LivingLeeAnn Marie Tayley Mudrick, OTR/L Occupational Therapist [phone] (pager)

## 2013-12-01 NOTE — Plan of Care (Signed)
Problem: Acute Rehab PT Goals(only PT should resolve) Goal: Pt Will Ambulate Outcome: Progressing Goal: Pt Will Go Up/Down Stairs Outcome: Not Applicable Date Met:  48/27/07 Patient does not need to go up a flight of stairs - lives on main floor of home.

## 2013-12-01 NOTE — Progress Notes (Signed)
Subjective: 2 Days Post-Op Procedure(s) (LRB): TOTAL HIP ARTHROPLASTY ANTERIOR APPROACH (Right) Patient reports pain as mild.    Objective: Vital signs in last 24 hours: Temp:  [98.6 F (37 C)-99.3 F (37.4 C)] 99 F (37.2 C) (11/15 0552) Pulse Rate:  [66-80] 71 (11/15 0552) Resp:  [14-16] 16 (11/15 0800) BP: (90-102)/(31-57) 100/44 mmHg (11/15 0552) SpO2:  [97 %-100 %] 97 % (11/15 0552)  Intake/Output from previous day: 11/14 0701 - 11/15 0700 In: 1455 [P.O.:480; I.V.:975] Out: 1050 [Urine:1050] Intake/Output this shift: Total I/O In: -  Out: 200 [Urine:200]   Recent Labs  11/30/13 0615 12/01/13 0600  HGB 9.4* 8.8*    Recent Labs  11/30/13 0615 12/01/13 0600  WBC 4.7 5.1  RBC 3.11* 2.87*  HCT 28.8* 26.2*  PLT 134* 113*    Recent Labs  11/30/13 0615  NA 139  K 4.6  CL 101  CO2 26  BUN 32*  CREATININE 1.84*  GLUCOSE 123*  CALCIUM 8.6   No results for input(s): LABPT, INR in the last 72 hours.  Neurologically intact  Assessment/Plan: 2 Days Post-Op Procedure(s) (LRB): TOTAL HIP ARTHROPLASTY ANTERIOR APPROACH (Right) Up with therapy ,  Possible home Monday or Tuesday with HHPT and bath aide 3X per week times 2 wks.   YATES,MARK C 12/01/2013, 10:30 AM

## 2013-12-01 NOTE — Plan of Care (Signed)
Problem: Phase I Progression Outcomes Goal: Initial discharge plan identified Outcome: Completed/Met Date Met:  12/01/13 Goal: Hemodynamically stable Outcome: Completed/Met Date Met:  12/01/13 Goal: Other Phase I Outcomes/Goals Outcome: Not Applicable Date Met:  38/87/19  Problem: Phase II Progression Outcomes Goal: Ambulates Outcome: Completed/Met Date Met:  12/01/13 Goal: Tolerating diet Outcome: Completed/Met Date Met:  12/01/13 Goal: Other Phase II Outcomes/Goals Outcome: Completed/Met Date Met:  12/01/13  Problem: Phase III Progression Outcomes Goal: Pain controlled on oral analgesia Outcome: Completed/Met Date Met:  12/01/13 Goal: Ambulates Outcome: Completed/Met Date Met:  12/01/13 Goal: Incision clean - minimal/no drainage Outcome: Completed/Met Date Met:  12/01/13 Goal: Other Phase III Outcomes/Goals Outcome: Not Applicable Date Met:  59/74/71

## 2013-12-01 NOTE — Progress Notes (Signed)
Physical Therapy Treatment Patient Details Name: Shane DuffelHershel Gorley MRN: [id number] DOB: 05-Jul-1922 Today's Date: 12/01/2013    History of Present Illness 80 yo male right THA direct anterior approach.    PT Comments    Patient making gains with mobility and gait.  Able to negotiate 2 stairs today (has only 1 step into house - will modify goal from flight of stairs to 1 step).  Improved activity tolerance today.  Follow Up Recommendations  Home health PT;Supervision/Assistance - 24 hour     Equipment Recommendations  None recommended by PT    Recommendations for Other Services       Precautions / Restrictions Precautions Precautions: Fall Restrictions Weight Bearing Restrictions: Yes RLE Weight Bearing: Weight bearing as tolerated    Mobility  Bed Mobility               General bed mobility comments: Patient in chair as PT entered room.  Transfers Overall transfer level: Needs assistance Equipment used: Rolling walker (2 wheeled) Transfers: Sit to/from Stand Sit to Stand: Min assist         General transfer comment: Min assist for balance and to rise to standing.  Ambulation/Gait Ambulation/Gait assistance: Min guard Ambulation Distance (Feet): 160 Feet Assistive device: Rolling walker (2 wheeled) Gait Pattern/deviations: Step-to pattern;Decreased stance time - right;Decreased step length - left;Decreased weight shift to right;Antalgic;Trunk flexed Gait velocity: Decreased Gait velocity interpretation: Below normal speed for age/gender General Gait Details: Verbal cues for safe use of RW and to stand upright.  Patient with very flexed posture in standing/gait.   Stairs Stairs: Yes Stairs assistance: Min guard Stair Management: One rail Left;Step to pattern;Forwards Number of Stairs: 2 General stair comments: Patient with 1 step at home to get into house.  Verbal cues for negotiating stairs with 1 rail and step-to pattern.  Patient able to complete 2  stairs with min guard assist and increased time.  Wheelchair Mobility    Modified Rankin (Stroke Patients Only)       Balance                                    Cognition Arousal/Alertness: Awake/alert Behavior During Therapy: WFL for tasks assessed/performed Overall Cognitive Status: Within Functional Limits for tasks assessed                      Exercises      General Comments        Pertinent Vitals/Pain Pain Assessment: 0-10 Pain Score: 5  Pain Location: Rt hip Pain Descriptors / Indicators: Aching;Sore Pain Intervention(s): Monitored during session;Patient requesting pain meds-RN notified    Home Living                      Prior Function            PT Goals (current goals can now be found in the care plan section) Progress towards PT goals: Progressing toward goals    Frequency  7X/week    PT Plan Current plan remains appropriate    Co-evaluation             End of Session Equipment Utilized During Treatment: Gait belt Activity Tolerance: Patient limited by fatigue;Patient limited by pain Patient left: in chair;with call bell/phone within reach;with nursing/sitter in room     Time: 1200-1223 PT Time Calculation (min) (ACUTE ONLY): 23 min  Charges:  $Gait  Training: 23-37 mins                    G Codes:      Vena AustriaDavis, Nathon Stefanski H 12/01/2013, 1:37 PM Durenda HurtSusan H. Renaldo Fiddleravis, PT, [facility] Acute Rehab Services Pager [phone]

## 2013-12-01 NOTE — Plan of Care (Signed)
Problem: Acute Rehab PT Goals(only PT should resolve) Goal: Pt Will Go Up/Down Stairs Outcome: Completed/Met Date Met:  12/01/13

## 2013-12-02 ENCOUNTER — Encounter (HOSPITAL_COMMUNITY): Payer: Self-pay | Admitting: *Deleted

## 2013-12-02 LAB — CBC
HEMATOCRIT: 25.7 % — AB (ref 39.0–52.0)
Hemoglobin: 8.6 g/dL — ABNORMAL LOW (ref 13.0–17.0)
MCH: 30.4 pg (ref 26.0–34.0)
MCHC: 33.5 g/dL (ref 30.0–36.0)
MCV: 90.8 fL (ref 78.0–100.0)
Platelets: 127 10*3/uL — ABNORMAL LOW (ref 150–400)
RBC: 2.83 MIL/uL — ABNORMAL LOW (ref 4.22–5.81)
RDW: 15.1 % (ref 11.5–15.5)
WBC: 4.6 10*3/uL (ref 4.0–10.5)

## 2013-12-02 MED ORDER — FLEET ENEMA 7-19 GM/118ML RE ENEM: 1.0000 | ENEMA | Freq: Every day | RECTAL | Status: DC | PRN

## 2013-12-02 MED ORDER — BISACODYL 10 MG RE SUPP
10.0000 mg | Freq: Once | RECTAL | Status: AC
  Administered 2013-12-02: 10 mg via RECTAL
  Filled 2013-12-02: qty 1

## 2013-12-02 NOTE — Progress Notes (Signed)
Subjective: 3 Days Post-Op Procedure(s) (LRB): TOTAL HIP ARTHROPLASTY ANTERIOR APPROACH (Right) Patient reports pain as mild.    Objective: Vital signs in last 24 hours: Temp:  [98.1 F (36.7 C)-98.2 F (36.8 C)] 98.2 F (36.8 C) (11/16 0455) Pulse Rate:  [64-71] 71 (11/16 0455) Resp:  [16] 16 (11/16 0455) BP: (104-118)/(37-51) 112/51 mmHg (11/16 0455) SpO2:  [100 %] 100 % (11/16 0455)  Intake/Output from previous day: 11/15 0701 - 11/16 0700 In: 720 [P.O.:720] Out: 1350 [Urine:1350] Intake/Output this shift:     Recent Labs  11/30/13 0615 12/01/13 0600 12/02/13 0451  HGB 9.4* 8.8* 8.6*    Recent Labs  12/01/13 0600 12/02/13 0451  WBC 5.1 4.6  RBC 2.87* 2.83*  HCT 26.2* 25.7*  PLT 113* 127*    Recent Labs  11/30/13 0615  NA 139  K 4.6  CL 101  CO2 26  BUN 32*  CREATININE 1.84*  GLUCOSE 123*  CALCIUM 8.6   No results for input(s): LABPT, INR in the last 72 hours.  Neurologically intact  Assessment/Plan: 3 Days Post-Op Procedure(s) (LRB): TOTAL HIP ARTHROPLASTY ANTERIOR APPROACH (Right) Up with therapy  Will decode on home tomorrow vs SNF.  He is making progress.   Zion Ta C 12/02/2013, 7:50 AM

## 2013-12-02 NOTE — Plan of Care (Signed)
Problem: Phase II Progression Outcomes Goal: Discharge plan established Outcome: Completed/Met Date Met:  12/02/13  Problem: Phase III Progression Outcomes Goal: Discharge plan remains appropriate-arrangements made Outcome: Completed/Met Date Met:  12/02/13  Problem: Discharge Progression Outcomes Goal: CMS/Neurovascular status at or above baseline Outcome: Completed/Met Date Met:  12/02/13 Goal: Pain controlled with appropriate interventions Outcome: Completed/Met Date Met:  12/02/13 Goal: Hemodynamically stable Outcome: Completed/Met Date Met:  97/98/92 Goal: Complications resolved/controlled Outcome: Completed/Met Date Met:  12/02/13 Goal: Tolerates diet Outcome: Completed/Met Date Met:  12/02/13 Goal: Activity appropriate for discharge plan Outcome: Completed/Met Date Met:  12/02/13 Goal: Ambulates safely using assistive device Outcome: Adequate for Discharge Goal: Follows weight - bearing limitations Outcome: Completed/Met Date Met:  12/02/13 Goal: Discharge plan in place and appropriate Outcome: Completed/Met Date Met:  12/02/13 Goal: Demonstrates ADLs as appropriate Outcome: Adequate for Discharge Goal: Other Discharge Outcomes/Goals Outcome: Not Applicable Date Met:  11/94/17

## 2013-12-02 NOTE — Progress Notes (Signed)
Physical Therapy Treatment Patient Details Name: Shane DuffelHershel [name] MRN: [id number] DOB: November 08, 1922 Today's Date: 12/02/2013    History of Present Illness 80 yo male right THA direct anterior    PT Comments    Patient making progress with mobility and gait.  However gait remains unsteady.  Patient reports wife is now with their daughter (illness).  Daughter unable to care for mother and father.  Patient would be at home alone.  When asking patient if he had someone to stay with him, he reports his neighbors could check on him.  Then he became distracted, talking about working puzzles.  Do not feel patient is safe to be at home alone.  Would recommend ST-SNF for continued therapy at discharge.   Follow Up Recommendations  SNF;Supervision/Assistance - 24 hour     Equipment Recommendations  None recommended by PT    Recommendations for Other Services       Precautions / Restrictions Precautions Precautions: Fall Restrictions Weight Bearing Restrictions: Yes RLE Weight Bearing: Weight bearing as tolerated    Mobility  Bed Mobility Overal bed mobility: Needs Assistance Bed Mobility: Supine to Sit;Sit to Supine     Supine to sit: Modified independent (Device/Increase time) Sit to supine: Min assist   General bed mobility comments: Patient able to move to EOB with no physical assist.  To return to supine, patient requires assist to bring LE's onto bed.  Transfers Overall transfer level: Needs assistance Equipment used: Rolling walker (2 wheeled) Transfers: Sit to/from Stand Sit to Stand: Min guard         General transfer comment: Verbal cues for hand placement.  Assist for balance and safety.  Ambulation/Gait Ambulation/Gait assistance: Min guard Ambulation Distance (Feet): 120 Feet Assistive device: Rolling walker (2 wheeled) Gait Pattern/deviations: Step-to pattern;Decreased stance time - right;Decreased step length - left;Decreased weight shift to  right;Antalgic;Trunk flexed Gait velocity: Decreased Gait velocity interpretation: Below normal speed for age/gender General Gait Details: Verbal cues for safe use of RW and to stand upright.  Patient with very flexed posture in standing/gait.  Slightly unsteady gait.   Stairs            Wheelchair Mobility    Modified Rankin (Stroke Patients Only)       Balance                                    Cognition Arousal/Alertness: Awake/alert Behavior During Therapy: WFL for tasks assessed/performed Overall Cognitive Status: Within Functional Limits for tasks assessed                      Exercises Total Joint Exercises Ankle Circles/Pumps: AROM;Both;10 reps;Supine Quad Sets: AROM;Right;10 reps;Supine Heel Slides: AROM;Right;10 reps;Supine Straight Leg Raises: AROM;Right;10 reps;Supine Long Arc Quad: AROM;Right;10 reps;Seated Marching in Standing: AROM;Both;10 reps;Seated    General Comments        Pertinent Vitals/Pain Pain Assessment: 0-10 Pain Score: 3  Pain Location: Rt hip Pain Descriptors / Indicators: Aching Pain Intervention(s): Monitored during session;Repositioned    Home Living                      Prior Function            PT Goals (current goals can now be found in the care plan section) Progress towards PT goals: Progressing toward goals    Frequency  7X/week    PT Plan Discharge  plan needs to be updated    Co-evaluation             End of Session Equipment Utilized During Treatment: Gait belt Activity Tolerance: Patient limited by fatigue Patient left: in bed;with call bell/phone within reach     Time: [id number] PT Time Calculation (min) (ACUTE ONLY): 31 min  Charges:  $Gait Training: 8-22 mins $Therapeutic Exercise: 8-22 mins                    G Codes:      Shane [name], Shane [name] 12/02/2013, 6:09 PM Shane [name], PT, [facility] Acute Rehab Services Pager [phone]

## 2013-12-03 MED ORDER — ASPIRIN 325 MG PO TBEC: 325.0000 mg | DELAYED_RELEASE_TABLET | Freq: Every day | ORAL | Status: DC

## 2013-12-03 NOTE — Clinical Social Work Placement (Signed)
Clinical Social Work Department CLINICAL SOCIAL WORK PLACEMENT NOTE 12/03/2013  Patient:  Shane [name],Shane [name]  Account Number:  [id number] Admit date:  11/29/2013  Clinical Social Worker:  Mosie EpsteinEMILY S Kiondre Grenz, LCSWA  Date/time:  12/03/2013 01:25 PM  Clinical Social Work is seeking post-discharge placement for this patient at the following level of care:   SKILLED NURSING   (*CSW will update this form in Epic as items are completed)   12/03/2013  Patient/family provided with [facility] Department of Clinical Social Work's list of facilities offering this level of care within the geographic area requested by the patient (or if unable, by the patient's family).  12/03/2013  Patient/family informed of their freedom to choose among providers that offer the needed level of care, that participate in Medicare, Medicaid or managed care program needed by the patient, have an available bed and are willing to accept the patient.  12/03/2013  Patient/family informed of [facility]' ownership interest in [facility], as well as of the fact that they are under no obligation to receive care at this facility.  PASARR submitted to EDS on  PASARR number received on   FL2 transmitted to all facilities in geographic area requested by pt/family on  12/03/2013 FL2 transmitted to all facilities within larger geographic area on   Patient informed that his/her managed care company has contracts with or will negotiate with  certain facilities, including the following:     Patient/family informed of bed offers received:  12/03/2013 Patient chooses bed at Other Physician recommends and patient chooses bed at    Patient to be transferred to Other on  12/03/2013 Patient to be transferred to facility by [facility] Patient and family notified of transfer on 12/03/2013 Name of family member notified:  Pt notified at bedside, pt's step-daughter Shanda Bumps(Jessica) updated  The following physician request were entered in  Epic:   Additional Comments: Pt to be [facility] on 12/03/2013. No PASARR or FL2 required for [facility] placement.  Marcelline Deistmily Amadu Schlageter, LCSWA [phone]) Licensed Clinical Social Worker Orthopedics [phone]([id number]) and Surgical [phone]([id number])

## 2013-12-03 NOTE — Clinical Social Work Note (Signed)
Pt to be discharged to [facility]. Pt updated at bedside regarding dischage.  [facility]: [phone] Transportation: [facility]) scheduled for 2pm.  Lily Kochermily Lurline Caver, LCSWA [phone]) Licensed Clinical Social Worker Orthopedics [phone]([id number]) and Surgical [phone]([id number])

## 2013-12-03 NOTE — Clinical Social Work Psychosocial (Signed)
Clinical Social Work Department BRIEF PSYCHOSOCIAL ASSESSMENT 12/03/2013  Patient:  Shane [name],Shane [name]     Account Number:  [id number]     Admit date:  11/29/2013  Clinical Social Worker:  Delrae Sawyers  Date/Time:  12/03/2013 01:16 PM  Referred by:  Physician  Date Referred:  12/03/2013 Referred for  SNF Placement   Other Referral:   none.   Interview type:  Patient Other interview type:   none.    PSYCHOSOCIAL DATA Living Status:  WIFE Admitted from facility:   Level of care:   Primary support name:  Danish Ruffins Primary support relationship to patient:  SPOUSE Degree of support available:   Strong support system.    CURRENT CONCERNS Current Concerns  Post-Acute Placement   Other Concerns:   none.    SOCIAL WORK ASSESSMENT / PLAN CSW received referral for possible SNF placement at time of discharge. CSW informed pt is a *bundle patient* and will be discharging to [facility] in [place], [place].    CSW met with pt at bedside to discuss discharge disposition. Pt confirmed with CSW pt will be discharged to [facility]. Pt stated pt lives with pt's wife, Shane [name], and plans to return home with wife after completing rehabilitation.    CSW to continue to follow and assist with discharge planning needs.   Assessment/plan status:  Psychosocial Support/Ongoing Assessment of Needs Other assessment/ plan:   none.   Information/referral to community resources:   Pt to be discharged to [facility].    PATIENT'S/FAMILY'S RESPONSE TO PLAN OF CARE: Pt understanding and agreeable to CSW plan of care. Pt expressed no further questions or concerns at this time.       Lubertha Sayres, [place] [phone]) Licensed Clinical Social Worker Orthopedics [phone]) and Surgical [phone])

## 2013-12-03 NOTE — Discharge Summary (Signed)
Physician Discharge Summary  Patient ID: Pamala DuffelHershel Trego MRN: [id number] DOB/AGE: [birth year] 80 y.o.  Admit date: 11/29/2013 Discharge date: 12/03/2013  Admission Diagnoses:RIGHT HIP OA  Discharge Diagnoses: RIGHT HIP OA,  PROCEDURE RIGHT THA Principal Problem:   Primary osteoarthritis of right hip Active Problems:   Postoperative anemia due to acute blood loss   Discharged Condition: IMPROVED  Hospital Course: AFTER INFORMED CONSENT UNDERWENT THA DIRECT ANTERIOR APPROACH. ANCEF PROPHYLAXIS, POST OP THERAPY. SOME BALANCE PROBLEMS, WIFE IN THE HOSPITAL . PHYSICAL THERAPY RECOMMENDED SNF.   Consults: PHYSICAL AND OCCUPATIONAL THERAPY  Significant Diagnostic Studies:   Treatments: RIGHT TOTAL HIP ARTHROPLASTY  Discharge Exam: Blood pressure 108/62, pulse 54, temperature 98.4 F (36.9 C), temperature source Oral, resp. rate 16, height 5\' 8"  (1.727 m), weight 83.915 kg (185 lb), SpO2 96 %.  leg length equal , ambulating in the halls with therapy. Some balance problems.   Disposition: Final discharge disposition not confirmed  Discharge Instructions    Anterior total hip precautions    Complete by:  As directed      Full weight bearing    Complete by:  As directed   Laterality:  right  Extremity:  Lower            Medication List    TAKE these medications        aspirin 325 MG EC tablet  Take 1 tablet (325 mg total) by mouth daily with breakfast.     docusate sodium 100 MG capsule  Commonly known as:  COLACE  Take 100 mg by mouth daily.     folic acid 1 MG tablet  Commonly known as:  FOLVITE  Take 1 mg by mouth daily.     furosemide 40 MG tablet  Commonly known as:  LASIX  Take 20 mg by mouth daily.     gemfibrozil 600 MG tablet  Commonly known as:  LOPID  Take 600 mg by mouth 2 (two) times daily before a meal.     oxyCODONE-acetaminophen 5-325 MG per tablet  Commonly known as:  ROXICET  Take 1-2 tablets by mouth every 4 (four) hours as needed for  moderate pain.     valsartan 80 MG tablet  Commonly known as:  DIOVAN  Take 80 mg by mouth daily.     vitamin B-12 1000 MCG tablet  Commonly known as:  CYANOCOBALAMIN  Take 1,000 mcg by mouth daily.           Follow-up Information    Follow up with Eldred MangesYATES,MARK C, MD. Schedule an appointment as soon as possible for a visit in 2 weeks.   Specialty:  Orthopedic Surgery   Contact information:   [address] [place] [address] [phone]       Signed: Eldred MangesYATES,MARK C 12/03/2013, 9:28 AM

## 2013-12-03 NOTE — Progress Notes (Signed)
[facility]ed report [facility] [place] [facility]

## 2013-12-03 NOTE — Discharge Instructions (Signed)
Keep hip incision dry for 5 days post op then may wet while bathing. Therapy daily . Call if fever or chills or increased drainage. Go to ER if acutely short of breath or call for ambulance. Return for follow up in 2 weeks. May full weight bear on the surgical leg unless told otherwise. In house walking for first 2 weeks.  Ice packs as needed for pain and swelling     TAKE ONE ASPIRIN DAILY FOR 4 WKS TO HELP PREVENT BLOOD CLOTS IN YOU LEGS

## 2013-12-03 NOTE — Progress Notes (Addendum)
Subjective: 4 Days Post-Op Procedure(s) (LRB): TOTAL HIP ARTHROPLASTY ANTERIOR APPROACH (Right) Patient reports pain as 4 on 0-10 scale.    Objective: Vital signs in last 24 hours: Temp:  [97.9 F (36.6 C)-98.4 F (36.9 C)] 98.4 F (36.9 C) (11/17 0537) Pulse Rate:  [54-80] 54 (11/17 0537) Resp:  [16] 16 (11/16 1300) BP: (100-141)/(41-120) 108/62 mmHg (11/17 0537) SpO2:  [96 %-100 %] 96 % (11/17 0537)  Intake/Output from previous day: 11/16 0701 - 11/17 0700 In: 720 [P.O.:720] Out: 900 [Urine:900] Intake/Output this shift:     Recent Labs  12/01/13 0600 12/02/13 0451  HGB 8.8* 8.6*    Recent Labs  12/01/13 0600 12/02/13 0451  WBC 5.1 4.6  RBC 2.87* 2.83*  HCT 26.2* 25.7*  PLT 113* 127*   No results for input(s): NA, K, CL, CO2, BUN, CREATININE, GLUCOSE, CALCIUM in the last 72 hours. No results for input(s): LABPT, INR in the last 72 hours.  Neurologically intact  Assessment/Plan: 4 Days Post-Op Procedure(s) (LRB): TOTAL HIP ARTHROPLASTY ANTERIOR APPROACH (Right) Discharge to SNF,  Per therapy not safe due to balance. He wants SNF  Close to Home.   Shane [name] C 12/03/2013, 7:53 AM

## 2013-12-03 NOTE — Care Management Note (Signed)
CARE MANAGEMENT NOTE 12/03/2013  Patient:  Shane [name],Shane [name]   Account Number:  [id number]  Date Initiated:  11/30/2013  Documentation initiated by:  Oliveras-Aizpurua,Jeannette  Subjective/Objective Assessment:   80 yo male admitted for a R THA.     Action/Plan:   Met with pt. He plans to return home with the support of his wife and step daughter. He has a walker. He needs a bed side commode. He doesn't have a preference for a [facility] provider for HHPT or DME.   Anticipated DC Date:  12/03/2013   Anticipated DC Plan:  [place]  CM consult      PAC Choice  NA   Choice offered to / List presented to:  C-1 Patient   DME arranged  [place]      DME agency  [place] arranged  [facility]   Status of service:  Completed, signed off Medicare Important Message given?  YES (If response is "NO", the following Medicare IM given date fields will be blank) Date Medicare IM given:  12/02/2013 Medicare IM given by:  Ricki Miller Date Additional Medicare IM given:   Additional Medicare IM given by:    Discharge Disposition:  [place]  Per UR Regulation:  Reviewed for med. necessity/level of care/duration of stay  If discussed at [place] of Stay Meetings, dates discussed:    Comments:  12/03/13 1:29pm Ricki Miller, RN BSN Case Manager Patient will need shortterm rehab per the Dr. and physical therapist. Patient will go to [facility] in [place].   12/02/13  Ricki Miller, RN BSN Case Manager  2nd IM message given to patient.
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
## 2015-07-20 IMAGING — RF DG HIP OPERATIVE*R*
1 series · 2 of 2 positions shown · non-contrast
Comparison: None.

CLINICAL DATA: Right hip replacement

EXAM:
DG OPERATIVE right HIP 1-2 VIEWS
TECHNIQUE: Fluoroscopic spot image(s) were submitted for interpretation
post-operatively.

[Series 1: run · 2 of 2 slices shown]
[im 1/2]
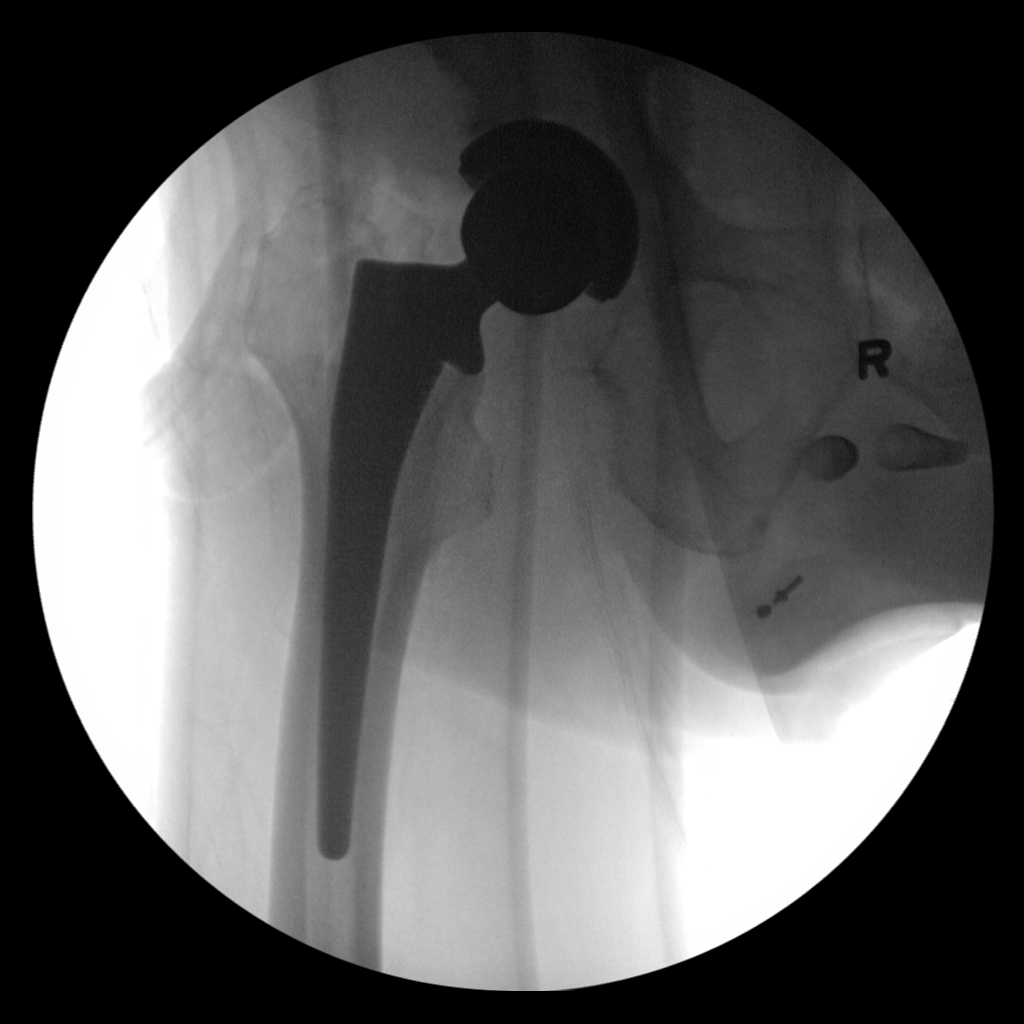
[im 2/2]
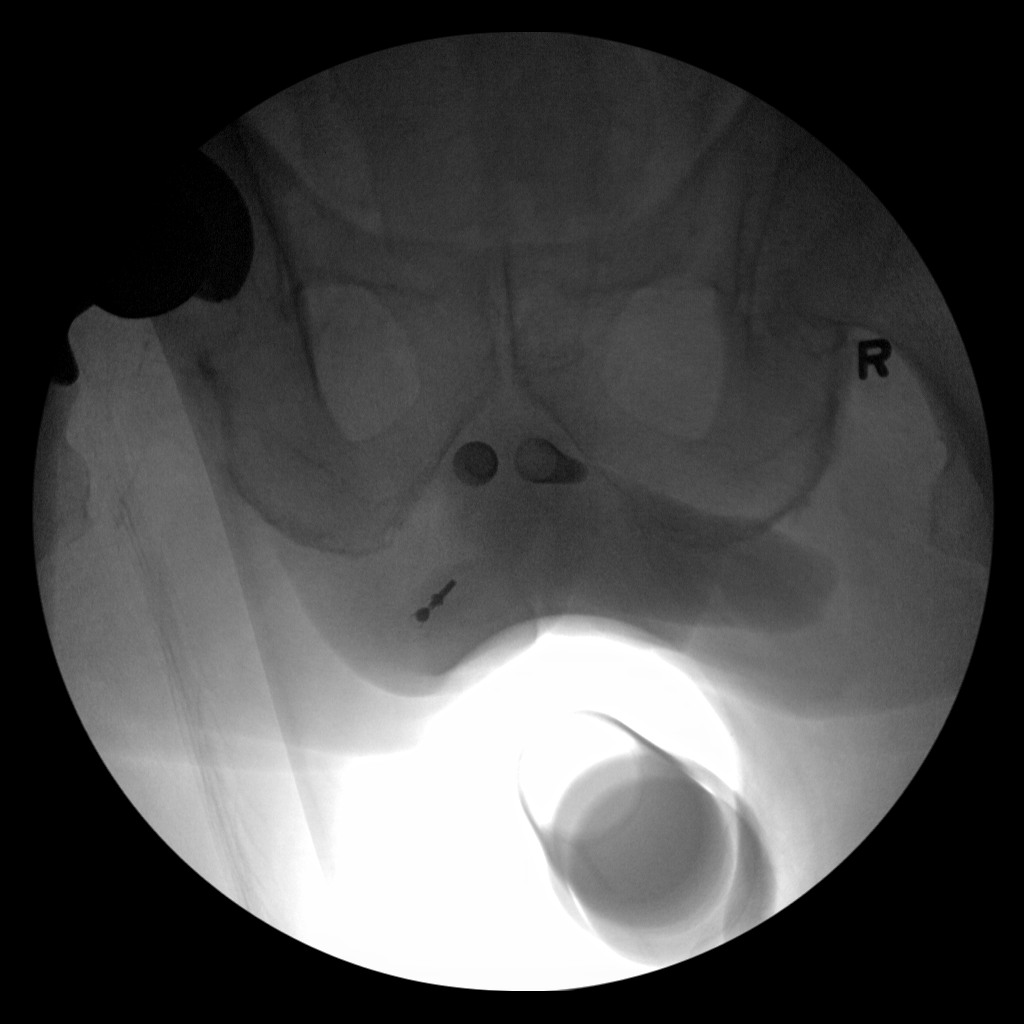

[2 of 2 positions shown; findings below may reference images not displayed]

FINDINGS: Two frontal images of the right hip show a total hip arthroplasty.
No evidence of periprosthetic fracture or dislocation.
IMPRESSION: Intraoperative fluoroscopy showing total right hip arthroplasty.

## 2015-07-22 ENCOUNTER — Encounter: Payer: Self-pay | Admitting: Cardiovascular Disease

## 2015-07-22 ENCOUNTER — Ambulatory Visit (INDEPENDENT_AMBULATORY_CARE_PROVIDER_SITE_OTHER): Payer: Medicare Other | Admitting: Cardiovascular Disease

## 2015-07-22 VITALS — BP 150/82 | HR 94 | Ht 68.0 in | Wt 174.0 lb

## 2015-07-22 DIAGNOSIS — I4891 Unspecified atrial fibrillation: Secondary | ICD-10-CM

## 2015-07-22 DIAGNOSIS — Z5181 Encounter for therapeutic drug level monitoring: Secondary | ICD-10-CM

## 2015-07-22 DIAGNOSIS — Z950 Presence of cardiac pacemaker: Secondary | ICD-10-CM

## 2015-07-22 DIAGNOSIS — Z136 Encounter for screening for cardiovascular disorders: Secondary | ICD-10-CM

## 2015-07-22 DIAGNOSIS — I442 Atrioventricular block, complete: Secondary | ICD-10-CM | POA: Diagnosis not present

## 2015-07-22 DIAGNOSIS — I10 Essential (primary) hypertension: Secondary | ICD-10-CM

## 2015-07-22 DIAGNOSIS — I25768 Atherosclerosis of bypass graft of coronary artery of transplanted heart with other forms of angina pectoris: Secondary | ICD-10-CM | POA: Diagnosis not present

## 2015-07-22 DIAGNOSIS — Z7901 Long term (current) use of anticoagulants: Secondary | ICD-10-CM

## 2015-07-22 DIAGNOSIS — I4892 Unspecified atrial flutter: Secondary | ICD-10-CM

## 2015-07-22 MED ORDER — APIXABAN 2.5 MG PO TABS: 2.5000 mg | ORAL_TABLET | Freq: Two times a day (BID) | ORAL | Status: DC

## 2015-07-22 NOTE — Progress Notes (Signed)
Patient ID: Shane [name], male   DOB: [birth year], 82 y.o.   MRN: [id number]       CARDIOLOGY CONSULT NOTE  Patient ID: Shane DuffelHershel Duan MRN: [id number] DOB/AGE: [birth year] 82 y.o.  Admit date: (Not on file) Primary Physician: Pcp Not In System Referring Physician: [place] MD  Reason for Consultation: CAD  HPI: The patient is a 82 year old gentleman with a history of coronary artery disease and CABG in 1981 and redo CABG in 1992. Coronary angiography in 2014 demonstrated patent LIMA to the LAD and SVG to the RCA. The SVG to the circumflex was occluded. He had normal left ventricular systolic function, EF 55-60%. He underwent pacemaker implantation for complete heart block in March 2015. He also has a history of atrial flutter and fibrillation as well as hypertension and hyperlipidemia. It appears he was started on Eliquis 2.5 mg twice daily in the past.  He said his systolic blood pressure normally runs 136. He received an injection in his back and it worked for about a week but then he began to have some shortness of breath which has gradually been improving. He clipped his hedges earlier today without difficulties. He lives by himself. He was initially on apixaban 2.5 mg twice daily and was then taken off of it for bruising and is now on aspirin 325 mg daily. [facility]   Pacemaker interrogation in January 2017 was reportedly normal.      No Known Allergies  Current Outpatient Prescriptions  Medication Sig Dispense Refill  . aspirin EC 325 MG EC tablet Take 1 tablet (325 mg total) by mouth daily with breakfast. 30 tablet 0  . docusate sodium (COLACE) 100 MG capsule Take 100 mg by mouth daily.    . folic acid (FOLVITE) 1 MG tablet Take 1 mg by mouth daily.    Marland Kitchen. gabapentin (NEURONTIN) 100 MG capsule Take 100 mg by mouth 2 (two) times daily.    Marland Kitchen. gemfibrozil (LOPID) 600 MG tablet Take 600 mg by mouth 2 (two) times daily before a meal.    . rosuvastatin  (CRESTOR) 40 MG tablet Take 40 mg by mouth daily.    Marland Kitchen. terazosin (HYTRIN) 2 MG capsule Take 2 mg by mouth at bedtime.    . vitamin B-12 (CYANOCOBALAMIN) 1000 MCG tablet Take 1,000 mcg by mouth daily.     No current facility-administered medications for this visit.    Past Medical History  Diagnosis Date  . Coronary artery disease   . Dysrhythmia     h/o  a flutter  . Hypertension   . Hyperlipidemia   . BPH (benign prostatic hyperplasia)   . Presence of permanent cardiac pacemaker     2015 for complete HB   St. Jude  pacer  . Arthritis   . Anemia     now taking iron pills    Past Surgical History  Procedure Laterality Date  . Coronary artery bypass graft      1981 with redo in 1992  . Cardiac catheterization      2014, EF is 55-60%  . Eye surgery      left eye cataract  . Insert / replace / remove pacemaker      St. Jude pacer  . Penile prosthesis implant    . Back surgery      [place] 4 back [place]  . Total hip arthroplasty Right 11/29/2013    Procedure: TOTAL HIP ARTHROPLASTY ANTERIOR APPROACH;  Surgeon: Loraine LericheMark  Becky Sax Yates, MD;  Location: [facility] OR;  Service: Orthopedics;  Laterality: Right;    Social History   Social History  . Marital Status: Married    Spouse Name: N/A  . Number of Children: N/A  . Years of Education: N/A   Occupational History  . Not on file.   Social History Main Topics  . Smoking status: Former Smoker -- 1.00 packs/day for 3.5 years    Quit date: 11/27/1987  . Smokeless tobacco: Never Used  . Alcohol Use: No     Comment: used to in his early yrs  . Drug Use: No  . Sexual Activity: Not on file   Other Topics Concern  . Not on file   Social History Narrative     No family history of premature CAD in 1st degree relatives.  Prior to Admission medications   Medication Sig Start Date End Date Taking? Authorizing Provider  aspirin EC 325 MG EC tablet Take 1 tablet (325 mg total) by mouth daily with breakfast. 12/03/13    Eldred MangesMark C Yates, MD  docusate sodium (COLACE) 100 MG capsule Take 100 mg by mouth daily.    Historical Provider, MD  folic acid (FOLVITE) 1 MG tablet Take 1 mg by mouth daily.    Historical Provider, MD  furosemide (LASIX) 40 MG tablet Take 20 mg by mouth daily.    Historical Provider, MD  gemfibrozil (LOPID) 600 MG tablet Take 600 mg by mouth 2 (two) times daily before a meal.    Historical Provider, MD  oxyCODONE-acetaminophen (ROXICET) 5-325 MG per tablet Take 1-2 tablets by mouth every 4 (four) hours as needed for moderate pain. 11/29/13   Maud DeedSheila Vernon, PA-C  valsartan (DIOVAN) 80 MG tablet Take 80 mg by mouth daily.    Historical Provider, MD  vitamin B-12 (CYANOCOBALAMIN) 1000 MCG tablet Take 1,000 mcg by mouth daily.    Historical Provider, MD     Review of systems complete and found to be negative unless listed above in HPI     Physical exam Blood pressure 150/82, pulse 94, height 5\' 8"  (1.727 m), weight 174 lb (78.926 kg), SpO2 95 %. General: NAD Neck: No JVD, no thyromegaly or thyroid nodule.  Lungs: Clear to auscultation bilaterally with normal respiratory effort. CV: Nondisplaced PMI. Regular rate and irregular rhythm, normal S1/S2, no S3, no murmur.  No peripheral edema.   Abdomen: Soft, nontender, no distention.  Skin: Ecchymoses on b/l forearms. Neurologic: Alert and oriented x 3.  Psych: Normal affect. Extremities: No clubbing or cyanosis.  HEENT: Normal.   ECG: Most recent ECG reviewed.  Labs:   Lab Results  Component Value Date   WBC 4.6 12/02/2013   HGB 8.6* 12/02/2013   HCT 25.7* 12/02/2013   MCV 90.8 12/02/2013   PLT 127* 12/02/2013   No results for input(s): NA, K, CL, CO2, BUN, CREATININE, CALCIUM, PROT, BILITOT, ALKPHOS, ALT, AST, GLUCOSE in the last 168 hours.  Invalid input(s): LABALBU No results found for: CKTOTAL, CKMB, CKMBINDEX, TROPONINI No results found for: CHOL No results found for: HDL No results found for: LDLCALC No results found for:  TRIG No results found for: CHOLHDL No results found for: LDLDIRECT       Studies: No results found.  ASSESSMENT AND PLAN:  1. CAD with redo CABG: Stable. Continue statin. Will stop ASA as I am starting Eliquis.  2. Pacemaker for CHB: Will enroll in pacemaker clinic and have him established with EP.  3. Atrial flutter and fibrillation:  Will d/c ASA and start apixaban 2.5 mg bid. Will check CBC and BMET.  4. Essential HTN: Elevated. Will monitor.  Dispo: fu 6 months   Signed: Prentice Docker, M.D., F.A.C.C.  07/22/2015, 1:53 PM

## 2015-07-22 NOTE — Patient Instructions (Signed)
Medication Instructions:   Stop Aspirin.  Begin Eliquis 2.5mg  twice a day - samples & free 30-day trial offer card given.    Patient will use medication that he already has at home (given from previous doctor).    Please call office for new [facility] when need.    Labwork:  BMET, CBC - orders given today.    Office will contact with results via phone or letter.    Referrals: Device clinic - Dr. Johney FrameAllred (pacemaker)  Testing/Procedures: NONE  Follow-Up:  Misty StanleyLisa (anticoagulation nurse) - 1 month   Your physician wants you to follow up in: 6 months.  You will receive a reminder letter in the mail one-two months in advance.  If you don't receive a letter, please call our office to schedule the follow up appointment      Any Other Special Instructions Will Be Listed Below (If Applicable).  If you need a refill on your cardiac medications before your next appointment, please call your pharmacy.

## 2015-07-23 ENCOUNTER — Telehealth: Payer: Self-pay | Admitting: Cardiovascular Disease

## 2015-07-23 NOTE — Telephone Encounter (Signed)
Noted  

## 2015-07-23 NOTE — Telephone Encounter (Signed)
Mr. Adalberto ColeDuffell called stating that [facility] O[facility]obert [facility] is the doctor he sees. Office # [phone] Fax # [phone]

## 2015-07-30 ENCOUNTER — Telehealth: Payer: Self-pay | Admitting: *Deleted

## 2015-07-30 NOTE — Telephone Encounter (Signed)
Notes Recorded by Lesle ChrisAngela G Hill, LPN on 1/61/09607/13/2017 at 1:07 PM Patient notified. Copy to [facility].  Notes Recorded by Laqueta LindenSuresh A Koneswaran, MD on 07/24/2015 at 1:47 PM CKD stage III. Continue present therapy.

## 2015-08-11 ENCOUNTER — Telehealth: Payer: Self-pay | Admitting: Cardiovascular Disease

## 2015-08-11 NOTE — Telephone Encounter (Signed)
Patient called last week with specific dates that he would only be able to see Dr Johney Frame in [place] for his first visit with him.  Dr Johney Frame was not scheduled to see patients in office in [place] or [place] the days he gave.  When I tried to explain to the patient that I could have Melissa contact him when she came back to try to find a date, he became upset and told me that he would not be scheduling any appointment if Allred could not see him the dates he had given.

## 2015-08-25 ENCOUNTER — Ambulatory Visit (INDEPENDENT_AMBULATORY_CARE_PROVIDER_SITE_OTHER): Payer: Medicare Other | Admitting: *Deleted

## 2015-08-25 ENCOUNTER — Encounter: Payer: Self-pay | Admitting: Internal Medicine

## 2015-08-25 DIAGNOSIS — I4891 Unspecified atrial fibrillation: Secondary | ICD-10-CM | POA: Diagnosis not present

## 2015-08-25 NOTE — Progress Notes (Signed)
Pt was started on Eliquis 2.5mg  bid for atrial fibrillation on 07/22/15 by Dr Purvis SheffieldKoneswaran.    Labs from 07/22/15:  SCr 1.37  CrCl 39.24  H/H 12.5/38.7  Pt denies problems since starting Eliquis.   Denies excessive bruising, bleeding or GI upset.  Reviewed patients medication list.  Pt is not currently on any combined P-gp and strong CYP3A4 inhibitors/inducers (ketoconazole, traconazole, ritonavir, carbamazepine, phenytoin, rifampin, St. John's wort).  Reviewed labs.  SCr 1.35, Weight 173, CrCl 39.56 .  Dose is appropriate based on CrCl.   Hgb and HCT:  11.2/35.6  A full discussion of the nature of anticoagulants has been carried out.  A benefit/risk analysis has been presented to the patient, so that they understand the justification for choosing anticoagulation with Eliquis at this time.  The need for compliance is stressed.  Pt is aware to take the medication twice daily.  Side effects of potential bleeding are discussed, including unusual colored urine or stools, coughing up blood or coffee ground emesis, nose bleeds or serious fall or head trauma.  Discussed signs and symptoms of stroke. The patient should avoid any OTC items containing aspirin or ibuprofen.  Avoid alcohol consumption.   Call if any signs of abnormal bleeding.  Discussed financial obligations and resolved any difficulty in obtaining medication.  Next lab test test in 4 months.

## 2015-09-14 ENCOUNTER — Telehealth: Payer: Self-pay | Admitting: Cardiovascular Disease

## 2015-09-14 NOTE — Telephone Encounter (Signed)
Pt aware and voiced understanding of risk a-fib and stroke but declines to try other alternate medications also declined appt to discuss. He was appreciative of call and concern. Aware that he may call back at anytime to make an appointment to discuss or try different anti coag meds.

## 2015-09-14 NOTE — Telephone Encounter (Signed)
Mr. Shane [name] called stating that he thinks the Eliquis is causing him to have extreme shortness of breath and he is going to quit taking it. He is wanting to start back on his aspirin.

## 2015-09-14 NOTE — Telephone Encounter (Signed)
If he had worsening SOB he should probably have evaluation in the office to make sure he does not have worsening anemia as a result of the blood thinner - Eliquis does not usually cause SOB unless there has been a decline in anemia so he should have a f/u blood count. Also, please let him know he is at increased stroke risk off this medicine - aspirin will not prevent stroke in patients with atrial fib, but ultimately he can decide for himself whether or not he wants to continue a particular medicine. We also have alternatives to Eliquis such as Xarelto, Pradaxa, and Coumadin but we would need to make sure he is not bleeding (i.e. F/u CBC) before changing him to an alternative if he wants to discuss that with Dr. Lucio EdwardK. Dayna Dunn PA-C

## 2015-09-14 NOTE — Telephone Encounter (Signed)
Pt says he stopped taking Eliquis 2.5 mg bid  2 days ago due to SOB that started 2 days ago. Says he feels better since stopping Eliquis - pt denies CP/dizziness/swelling. Says he wants to stay on 81 mg ASA which he has been taking since stopping Eliquis - pt reminded of stopping medication without provider input. Pt says the SOB has cleared up since stopping Eliquis and he has been taking ASA and has no intention of restarting Eliquis - just wanted to let Dr. Purvis SheffieldKoneswaran know. Will forward to covering provider.

## 2015-09-28 ENCOUNTER — Ambulatory Visit (INDEPENDENT_AMBULATORY_CARE_PROVIDER_SITE_OTHER): Payer: Medicare Other | Admitting: Cardiovascular Disease

## 2015-09-28 ENCOUNTER — Encounter: Payer: Self-pay | Admitting: Cardiovascular Disease

## 2015-09-28 VITALS — BP 144/83 | HR 102 | Ht 68.0 in | Wt 174.0 lb

## 2015-09-28 DIAGNOSIS — R0602 Shortness of breath: Secondary | ICD-10-CM

## 2015-09-28 DIAGNOSIS — I4891 Unspecified atrial fibrillation: Secondary | ICD-10-CM

## 2015-09-28 DIAGNOSIS — Z5181 Encounter for therapeutic drug level monitoring: Secondary | ICD-10-CM

## 2015-09-28 DIAGNOSIS — Z950 Presence of cardiac pacemaker: Secondary | ICD-10-CM

## 2015-09-28 DIAGNOSIS — R0609 Other forms of dyspnea: Secondary | ICD-10-CM

## 2015-09-28 DIAGNOSIS — Z7901 Long term (current) use of anticoagulants: Secondary | ICD-10-CM

## 2015-09-28 DIAGNOSIS — I25768 Atherosclerosis of bypass graft of coronary artery of transplanted heart with other forms of angina pectoris: Secondary | ICD-10-CM

## 2015-09-28 DIAGNOSIS — R000 Tachycardia, unspecified: Secondary | ICD-10-CM

## 2015-09-28 DIAGNOSIS — I10 Essential (primary) hypertension: Secondary | ICD-10-CM

## 2015-09-28 MED ORDER — METOPROLOL TARTRATE 25 MG PO TABS: 25.0000 mg | ORAL_TABLET | Freq: Two times a day (BID) | ORAL | 3 refills | Status: AC

## 2015-09-28 MED ORDER — METOPROLOL TARTRATE 25 MG PO TABS: 25.0000 mg | ORAL_TABLET | Freq: Two times a day (BID) | ORAL | 3 refills | Status: DC

## 2015-09-28 NOTE — Progress Notes (Signed)
SUBJECTIVE: The patient is a 82 year old gentleman with a history of coronary artery disease and CABG in 1981 and redo CABG in 1992. Coronary angiography in 2014 demonstrated patent LIMA to the LAD and SVG to the RCA. The SVG to the circumflex was occluded. He had normal left ventricular systolic function, EF 55-60%. He underwent pacemaker implantation for complete heart block in March 2015. He also has a history of atrial flutter and fibrillation as well as hypertension and hyperlipidemia. It appears he was started on Eliquis 2.5 mg twice daily in the past.  He stopped Eliquis thinking it caused SOB. D. Dunn PA-C recommended CBC to evaluate for anemia. Says SOB initially resolved. Started ASA on his own.  However, it has recurred and he is unable to do his knee bending exercises and becomes dyspneic with minimal exertion. No significant chest pains.   Review of Systems: As per "subjective", otherwise negative.  No Known Allergies  Current Outpatient Prescriptions  Medication Sig Dispense Refill  . docusate sodium (COLACE) 100 MG capsule Take 100 mg by mouth daily as needed.     . folic acid (FOLVITE) 1 MG tablet Take 1 mg by mouth daily.    Marland Kitchen. gemfibrozil (LOPID) 600 MG tablet Take 600 mg by mouth 2 (two) times daily before a meal.    . rosuvastatin (CRESTOR) 40 MG tablet Take 40 mg by mouth daily.    Marland Kitchen. terazosin (HYTRIN) 2 MG capsule Take 2 mg by mouth at bedtime.    . vitamin B-12 (CYANOCOBALAMIN) 1000 MCG tablet Take 1,000 mcg by mouth daily.    Marland Kitchen. apixaban (ELIQUIS) 2.5 MG TABS tablet Take 1 tablet (2.5 mg total) by mouth 2 (two) times daily. (Patient not taking: Reported on 09/28/2015) 60 tablet 6   No current facility-administered medications for this visit.     Past Medical History:  Diagnosis Date  . Anemia    now taking iron pills  . Arthritis   . BPH (benign prostatic hyperplasia)   . Coronary artery disease   . Dysrhythmia    h/o  a flutter  . Hyperlipidemia   .  Hypertension   . Presence of permanent cardiac pacemaker    2015 for complete HB   St. Jude  pacer    Past Surgical History:  Procedure Laterality Date  . BACK SURGERY     has had 4 back surgeries in [place]  . CARDIAC CATHETERIZATION     2014, EF is 55-60%  . CORONARY ARTERY BYPASS GRAFT     1981 with redo in 1992  . EYE SURGERY     left eye cataract  . INSERT / REPLACE / REMOVE PACEMAKER     St. Jude pacer  . PENILE PROSTHESIS IMPLANT    . TOTAL HIP ARTHROPLASTY Right 11/29/2013   Procedure: TOTAL HIP ARTHROPLASTY ANTERIOR APPROACH;  Surgeon: Eldred MangesMark C Yates, MD;  Location: [facility] OR;  Service: Orthopedics;  Laterality: Right;    Social History   Social History  . Marital status: Married    Spouse name: N/A  . Number of children: N/A  . Years of education: N/A   Occupational History  . Not on file.   Social History Main Topics  . Smoking status: Former Smoker    Packs/day: 1.00    Years: 3.50    Quit date: 11/27/1987  . Smokeless tobacco: Never Used  . Alcohol use No     Comment: used to in his early yrs  . Drug  use: No  . Sexual activity: Not on file   Other Topics Concern  . Not on file   Social History Narrative  . No narrative on file     Vitals:   09/28/15 1442  BP: (!) 144/83  Pulse: (!) 102  SpO2: 99%  Weight: 174 lb (78.9 kg)  Height: 5\' 8"  (1.727 m)    PHYSICAL EXAM General: NAD Neck: No JVD, no thyromegaly or thyroid nodule.  Lungs: Clear to auscultation bilaterally with normal respiratory effort. CV: Nondisplaced PMI. Tachycardic, irregular rhythm, normal S1/S2, no S3, no murmur.  No peripheral edema.   Abdomen: Soft, nontender, no distention.  Skin: Ecchymoses on b/l forearms. Neurologic: Alert and oriented x 3.  Psych: Normal affect. Extremities: No clubbing or cyanosis.  HEENT: Normal.      ECG: Most recent ECG reviewed.      ASSESSMENT AND PLAN: 1. CAD with redo CABG with DOE: Will obtain Lexiscan. Start metoprolol 25  mg BID. Continue ASA and statin.   2. Pacemaker for CHB: Have already enrolled in pacemaker clinic to have him established with EP.  3. Atrial flutter and fibrillation: Stopped Eliquis on his own due to SOB. Taking ASA of his own accord. Tachycardic, thus will start metoprolol 25 mg BID.  4. Essential HTN: Elevated. Will monitor given initiation of metoprolol.  Dispo: fu 6 weeks.  Time spent: 40 minutes, of which greater than 50% was spent reviewing symptoms, relevant blood tests and studies, and discussing management plan with the patient.    Prentice Docker, M.D., F.A.C.C.

## 2015-09-28 NOTE — Patient Instructions (Addendum)
Your physician recommends that you schedule a follow-up appointment in: 1 month  Your physician has requested that you have a lexiscan myoview. For further information please visit [email or web address]. Please follow instruction sheet, as given.    START Metoprolol 25 mg twice a day     Thank you for choosing [facility] !

## 2015-10-01 ENCOUNTER — Telehealth: Payer: Self-pay | Admitting: Cardiovascular Disease

## 2015-10-01 NOTE — Telephone Encounter (Signed)
Calling to c/o continued SOB.  Worse with activity.  Stated he did take NTG x 1 this morning & that did help.  Stated that he can't hardly get around.  Not getting any better.  Message fwd to provider for advice.

## 2015-10-01 NOTE — Telephone Encounter (Signed)
He JUST started metoprolol. Awaiting stress test. Must give medicine a chance to work.

## 2015-10-01 NOTE — Telephone Encounter (Signed)
Patient notified.  Stated that he has decided not to take the stress test due to a doctor in [place], [place] telling him to never take another one.  Was told his heart couldn't take it.  Patient continues to c/o phlegm & congestion in his chest & that he just can't breathe.  He is also questioning if his pacemaker could be the problem.  Is scheduled to see Dr. Ladona Ridgelaylor in [place] for the first time 11/09/15.  Does not have a local PMD, [facility].

## 2015-10-01 NOTE — Telephone Encounter (Signed)
Mr. Shane [name] called stating that he continues to have shortness of breath. He feels that maybe his medication is not working properly.

## 2015-10-02 NOTE — Telephone Encounter (Signed)
No answer

## 2015-10-05 NOTE — Telephone Encounter (Signed)
No answer

## 2015-10-06 NOTE — Telephone Encounter (Signed)
No return call to present.  

## 2015-10-07 ENCOUNTER — Encounter (HOSPITAL_COMMUNITY): Payer: Medicare Other

## 2015-10-07 ENCOUNTER — Ambulatory Visit (HOSPITAL_COMMUNITY): Payer: Medicare Other

## 2015-10-14 ENCOUNTER — Encounter (HOSPITAL_COMMUNITY): Payer: Medicare Other

## 2015-10-14 ENCOUNTER — Inpatient Hospital Stay (HOSPITAL_COMMUNITY): Admission: RE | Admit: 2015-10-14 | Payer: Medicare Other | Source: Ambulatory Visit

## 2015-10-30 ENCOUNTER — Ambulatory Visit: Payer: Medicare Other | Admitting: Cardiovascular Disease

## 2015-11-09 ENCOUNTER — Encounter: Payer: Self-pay | Admitting: Internal Medicine

## 2015-11-09 ENCOUNTER — Ambulatory Visit (INDEPENDENT_AMBULATORY_CARE_PROVIDER_SITE_OTHER): Payer: Medicare Other | Admitting: Internal Medicine

## 2015-11-09 VITALS — BP 126/64 | HR 63 | Ht 68.0 in | Wt 162.0 lb

## 2015-11-09 DIAGNOSIS — I25768 Atherosclerosis of bypass graft of coronary artery of transplanted heart with other forms of angina pectoris: Secondary | ICD-10-CM | POA: Diagnosis not present

## 2015-11-09 DIAGNOSIS — I4891 Unspecified atrial fibrillation: Secondary | ICD-10-CM | POA: Diagnosis not present

## 2015-11-09 DIAGNOSIS — Z950 Presence of cardiac pacemaker: Secondary | ICD-10-CM

## 2015-11-09 NOTE — Progress Notes (Signed)
HPI Mr. Shane [name] is referred today by Dr. Kirtland BouchardK for evaluation of his PPM. He is a pleasant 82 yo man with CHB, s/p PPM insertion, CAD, s/p CABG, with recent stenting. He had his Eliquis stopped by Dr. Georganna SkeansPainter and was switched to plavix after the stent placement. The patient denies chest pain or sob. He has some arthritis and notes that his right leg is longer than his left leg after surgery.  No Known Allergies   Current Outpatient Prescriptions  Medication Sig Dispense Refill  . aspirin 81 MG chewable tablet Chew by mouth daily. Takes 1/2 tab daily    . clopidogrel (PLAVIX) 75 MG tablet Take 75 mg by mouth daily.    Marland Kitchen. docusate sodium (COLACE) 100 MG capsule Take 100 mg by mouth daily as needed.     . folic acid (FOLVITE) 1 MG tablet Take 1 mg by mouth daily.    Marland Kitchen. gemfibrozil (LOPID) 600 MG tablet Take 600 mg by mouth 2 (two) times daily before a meal.    . metoprolol tartrate (LOPRESSOR) 25 MG tablet Take 1 tablet (25 mg total) by mouth 2 (two) times daily. 180 tablet 3  . rosuvastatin (CRESTOR) 40 MG tablet Take 40 mg by mouth daily.    Marland Kitchen. terazosin (HYTRIN) 2 MG capsule Take 2 mg by mouth at bedtime.    . vitamin B-12 (CYANOCOBALAMIN) 1000 MCG tablet Take 1,000 mcg by mouth daily.     No current facility-administered medications for this visit.      Past Medical History:  Diagnosis Date  . Anemia    now taking iron pills  . Arthritis   . BPH (benign prostatic hyperplasia)   . Coronary artery disease   . Dysrhythmia    h/o  a flutter  . Hyperlipidemia   . Hypertension   . Presence of permanent cardiac pacemaker    2015 for complete HB   St. Jude  pacer    ROS:   All systems reviewed and negative except as noted in the HPI.   Past Surgical History:  Procedure Laterality Date  . BACK SURGERY     has had 4 back surgeries in [place]  . CARDIAC CATHETERIZATION     2014, EF is 55-60%  . CORONARY ARTERY BYPASS GRAFT     1981 with redo in 1992  . EYE SURGERY     left eye cataract  . INSERT / REPLACE / REMOVE PACEMAKER     St. Jude pacer  . PENILE PROSTHESIS IMPLANT    . TOTAL HIP ARTHROPLASTY Right 11/29/2013   Procedure: TOTAL HIP ARTHROPLASTY ANTERIOR APPROACH;  Surgeon: Eldred MangesMark C Yates, MD;  Location: [facility] OR;  Service: Orthopedics;  Laterality: Right;     Family History  Problem Relation Age of Onset  . Heart failure Mother      Social History   Social History  . Marital status: Widowed    Spouse name: N/A  . Number of children: N/A  . Years of education: N/A   Occupational History  . Not on file.   Social History Main Topics  . Smoking status: Former Smoker    Packs/day: 1.00    Years: 3.50    Quit date: 11/27/1987  . Smokeless tobacco: Never Used  . Alcohol use No     Comment: used to in his early yrs  . Drug use: No  . Sexual activity: Not on file   Other Topics Concern  . Not on file  Social History Narrative  . No narrative on file     BP 126/64   Pulse 63   Ht 5\' 8"  (1.727 m)   Wt 162 lb (73.5 kg)   SpO2 99%   BMI 24.63 kg/m   Physical Exam:  Well appearing NAD HEENT: Unremarkable Neck:  No JVD, no thyromegally Lymphatics:  No adenopathy Back:  No CVA tenderness Lungs:  Clear HEART:  Regular rate rhythm, no murmurs, no rubs, no clicks Abd:  soft, positive bowel sounds, no organomegally, no rebound, no guarding Ext:  2 plus pulses, no edema, no cyanosis, no clubbing Skin:  No rashes no nodules Neuro:  CN II through XII intact, motor grossly intact  DEVICE  Normal device function.  See PaceArt for details.   Assess/Plan: 1. CHB - he is s/p DDD PM insertion 2. PPM - his St. Jude DDD PM is working normally. He prefers to followup in our [place] office as it is more convenient. 3. HTN - his blood pressure is well controlled. No change in meds. 4. CAD - he denies anginal symptoms. He takes ntg as needed  [employer].D.

## 2015-11-09 NOTE — Patient Instructions (Addendum)
Your physician wants you to follow-up in: 1 Year with Dr. Johney FrameAllred in [place].  You will receive a reminder letter in the mail two months in advance. If you don't receive a letter, please call our office to schedule the follow-up appointment.  Remote monitoring is used to monitor your Pacemaker of ICD from home. This monitoring reduces the number of office visits required to check your device to one time per year. It allows us to keep an eye on the functioning of your device to ensure it is working properly. You are scheduled for a device check from home on 02/08/16. You may send your transmission at any time that day. If you have a wireless device, the transmission will be sent automatically. After your physician reviews your transmission, you will receive a postcard with your next transmission date.  Your physician recommends that you continue on your current medications as directed. Please refer to the Current Medication list given to you today.  If you need a refill on your cardiac medications before your next appointment, please call your pharmacy.  Thank you for choosing [facility]!

## 2015-11-16 LAB — CUP PACEART INCLINIC DEVICE CHECK
Date Time Interrogation Session: 20171030121523
Implantable Lead Implant Date: 20150312
Implantable Lead Serial Number: 12345
Lead Channel Pacing Threshold Amplitude: 1 V
Lead Channel Pacing Threshold Pulse Width: 0.4 ms
MDC IDC LEAD LOCATION: 753860
MDC IDC MSMT BATTERY REMAINING PERCENTAGE: 95 %
MDC IDC MSMT BATTERY VOLTAGE: 3.01 V
MDC IDC MSMT LEADCHNL RA IMPEDANCE VALUE: 400 Ohm
MDC IDC MSMT LEADCHNL RV SENSING INTR AMPL: 12 mV
MDC IDC STAT BRADY RV PERCENT PACED: 62 %
Pulse Gen Serial Number: 7582992

## 2016-02-08 ENCOUNTER — Telehealth: Payer: Self-pay | Admitting: Cardiology

## 2016-02-08 ENCOUNTER — Encounter: Payer: Medicare Other | Admitting: *Deleted

## 2016-02-08 NOTE — Telephone Encounter (Signed)
Spoke with pt and reminded pt of remote transmission that is due today. Pt verbalized understanding.   

## 2016-03-01 ENCOUNTER — Encounter: Payer: Medicare Other | Admitting: *Deleted

## 2016-03-03 ENCOUNTER — Encounter: Payer: Self-pay | Admitting: Cardiology

## 2016-03-03 NOTE — Progress Notes (Signed)
Letter  

## 2016-03-04 ENCOUNTER — Ambulatory Visit (INDEPENDENT_AMBULATORY_CARE_PROVIDER_SITE_OTHER): Payer: Medicare Other | Admitting: *Deleted

## 2016-03-04 DIAGNOSIS — I442 Atrioventricular block, complete: Secondary | ICD-10-CM

## 2016-03-09 NOTE — Progress Notes (Signed)
Remote pacemaker transmission.   

## 2016-03-10 ENCOUNTER — Encounter: Payer: Self-pay | Admitting: Cardiology

## 2016-03-11 LAB — CUP PACEART REMOTE DEVICE CHECK
Date Time Interrogation Session: 20180223093705
Implantable Lead Location: 753860
Implantable Lead Serial Number: 12345
Implantable Pulse Generator Implant Date: 20150312
MDC IDC LEAD IMPLANT DT: 20150312
MDC IDC PG SERIAL: 7582992
Pulse Gen Model: 1240

## 2016-06-08 ENCOUNTER — Encounter: Payer: Medicare Other | Admitting: *Deleted

## 2016-06-10 ENCOUNTER — Encounter: Payer: Self-pay | Admitting: Cardiology

## 2017-03-27 ENCOUNTER — Ambulatory Visit (INDEPENDENT_AMBULATORY_CARE_PROVIDER_SITE_OTHER): Payer: Medicare Other | Admitting: *Deleted

## 2017-03-27 DIAGNOSIS — I442 Atrioventricular block, complete: Secondary | ICD-10-CM | POA: Diagnosis not present

## 2017-03-28 NOTE — Progress Notes (Signed)
Remote pacemaker transmission.   

## 2017-03-29 ENCOUNTER — Encounter: Payer: Self-pay | Admitting: Cardiology

## 2017-04-15 LAB — CUP PACEART REMOTE DEVICE CHECK
Brady Statistic RV Percent Paced: 96 %
Date Time Interrogation Session: 20190310220703
Implantable Lead Implant Date: 20150312
Implantable Lead Location: 753860
Implantable Lead Serial Number: 12345
Lead Channel Pacing Threshold Amplitude: 1.25 V
Lead Channel Pacing Threshold Pulse Width: 0.4 ms
Lead Channel Sensing Intrinsic Amplitude: 10 mV
Lead Channel Setting Pacing Amplitude: 2.5 V
MDC IDC MSMT BATTERY REMAINING LONGEVITY: 122 mo
MDC IDC MSMT BATTERY REMAINING PERCENTAGE: 95.5 %
MDC IDC MSMT BATTERY VOLTAGE: 2.99 V
MDC IDC MSMT LEADCHNL RV IMPEDANCE VALUE: 400 Ohm
MDC IDC PG IMPLANT DT: 20150312
MDC IDC SET LEADCHNL RV PACING PULSEWIDTH: 0.4 ms
MDC IDC SET LEADCHNL RV SENSING SENSITIVITY: 2 mV
Pulse Gen Serial Number: 7582992

## 2017-06-26 ENCOUNTER — Telehealth: Payer: Self-pay

## 2017-06-26 ENCOUNTER — Encounter: Payer: Self-pay | Admitting: Cardiology

## 2017-06-26 ENCOUNTER — Ambulatory Visit (INDEPENDENT_AMBULATORY_CARE_PROVIDER_SITE_OTHER): Payer: Medicare Other | Admitting: *Deleted

## 2017-06-26 DIAGNOSIS — I442 Atrioventricular block, complete: Secondary | ICD-10-CM | POA: Diagnosis not present

## 2017-06-26 NOTE — Telephone Encounter (Signed)
I spoke with the patient about this. We going to send pt instructions on how to send a transmission. He can not hear very well.

## 2017-06-27 ENCOUNTER — Encounter: Payer: Self-pay | Admitting: Cardiology

## 2017-06-27 NOTE — Progress Notes (Signed)
Remote pacemaker transmission.   

## 2017-07-03 NOTE — Telephone Encounter (Signed)
Called pt to send a manual transmission.

## 2017-07-11 LAB — CUP PACEART REMOTE DEVICE CHECK
Battery Remaining Percentage: 95.5 %
Battery Voltage: 2.99 V
Date Time Interrogation Session: 20190610204727
Implantable Lead Implant Date: 20150312
Implantable Lead Serial Number: 12345
Implantable Pulse Generator Implant Date: 20150312
Lead Channel Impedance Value: 350 Ohm
Lead Channel Pacing Threshold Amplitude: 1 V
Lead Channel Pacing Threshold Pulse Width: 0.4 ms
Lead Channel Setting Pacing Amplitude: 2.5 V
MDC IDC LEAD LOCATION: 753860
MDC IDC MSMT BATTERY REMAINING LONGEVITY: 121 mo
MDC IDC MSMT LEADCHNL RV SENSING INTR AMPL: 8.8 mV
MDC IDC SET LEADCHNL RV PACING PULSEWIDTH: 0.4 ms
MDC IDC SET LEADCHNL RV SENSING SENSITIVITY: 2 mV
MDC IDC STAT BRADY RV PERCENT PACED: 76 %
Pulse Gen Model: 1240
Pulse Gen Serial Number: 7582992

## 2017-07-17 ENCOUNTER — Telehealth: Payer: Self-pay | Admitting: *Deleted

## 2017-07-17 NOTE — Telephone Encounter (Signed)
Called patient about episode of V.noise recorded on 07/15/17 @ 0530. Patient states that he went to take his cats outside. Patient denies being around any new equipment. Patient states that he has occasional dizzy spells.  I explained to patient that the episode shows device inhibition d/t noise. I offered patient a ROV w/JA to have his device checked, but patient states that he will not be able to come to [place] d/t transportation issues. I offered patient an appt with JA in [place], but again patient declined stating that he would not be able to get there. He says that he would prefer to f/u with [place]ainer in [place]. Patient's appt isn't until 10/2017. I told patient to call and see if he could be seen sooner. Patient verbalized understanding.

## 2017-09-25 ENCOUNTER — Encounter: Payer: Medicare Other | Admitting: *Deleted

## 2021-11-17 DEATH — deceased
